# Patient Record
Sex: Male | Born: 1950 | Race: White | Hispanic: No | Smoking: Never smoker
Health system: Southern US, Community
[De-identification: ages and names within clinical notes are randomized; demographics above are authoritative.]

## PROBLEM LIST (undated history)

## (undated) DIAGNOSIS — R011 Cardiac murmur, unspecified: Secondary | ICD-10-CM

## (undated) DIAGNOSIS — H269 Unspecified cataract: Secondary | ICD-10-CM

## (undated) DIAGNOSIS — E7800 Pure hypercholesterolemia, unspecified: Secondary | ICD-10-CM

## (undated) DIAGNOSIS — F329 Major depressive disorder, single episode, unspecified: Secondary | ICD-10-CM

## (undated) DIAGNOSIS — G4730 Sleep apnea, unspecified: Secondary | ICD-10-CM

## (undated) DIAGNOSIS — R569 Unspecified convulsions: Secondary | ICD-10-CM

## (undated) DIAGNOSIS — D649 Anemia, unspecified: Secondary | ICD-10-CM

## (undated) DIAGNOSIS — T7840XA Allergy, unspecified, initial encounter: Secondary | ICD-10-CM

## (undated) DIAGNOSIS — R1310 Dysphagia, unspecified: Secondary | ICD-10-CM

## (undated) DIAGNOSIS — F1910 Other psychoactive substance abuse, uncomplicated: Secondary | ICD-10-CM

## (undated) DIAGNOSIS — N390 Urinary tract infection, site not specified: Secondary | ICD-10-CM

## (undated) DIAGNOSIS — F419 Anxiety disorder, unspecified: Secondary | ICD-10-CM

## (undated) DIAGNOSIS — D229 Melanocytic nevi, unspecified: Secondary | ICD-10-CM

## (undated) DIAGNOSIS — E079 Disorder of thyroid, unspecified: Secondary | ICD-10-CM

## (undated) DIAGNOSIS — F32A Depression, unspecified: Secondary | ICD-10-CM

## (undated) HISTORY — PX: ROTATOR CUFF REPAIR: SHX139

## (undated) HISTORY — DX: Pure hypercholesterolemia, unspecified: E78.00

## (undated) HISTORY — PX: JOINT REPLACEMENT: SHX530

## (undated) HISTORY — DX: Depression, unspecified: F32.A

## (undated) HISTORY — DX: Other psychoactive substance abuse, uncomplicated: F19.10

## (undated) HISTORY — PX: CATARACT EXTRACTION: SUR2

## (undated) HISTORY — DX: Cardiac murmur, unspecified: R01.1

## (undated) HISTORY — DX: Melanocytic nevi, unspecified: D22.9

## (undated) HISTORY — DX: Unspecified convulsions: R56.9

## (undated) HISTORY — DX: Disorder of thyroid, unspecified: E07.9

## (undated) HISTORY — PX: EYE SURGERY: SHX253

## (undated) HISTORY — DX: Sleep apnea, unspecified: G47.30

## (undated) HISTORY — DX: Dysphagia, unspecified: R13.10

## (undated) HISTORY — DX: Unspecified cataract: H26.9

## (undated) HISTORY — DX: Anemia, unspecified: D64.9

## (undated) HISTORY — DX: Allergy, unspecified, initial encounter: T78.40XA

## (undated) HISTORY — DX: Anxiety disorder, unspecified: F41.9

## (undated) HISTORY — DX: Urinary tract infection, site not specified: N39.0

## (undated) HISTORY — DX: Major depressive disorder, single episode, unspecified: F32.9

---

## 2002-01-28 ENCOUNTER — Inpatient Hospital Stay (HOSPITAL_COMMUNITY): Admission: EM | Admit: 2002-01-28 | Discharge: 2002-01-29 | Payer: Self-pay | Admitting: Emergency Medicine

## 2002-02-01 ENCOUNTER — Encounter: Admission: RE | Admit: 2002-02-01 | Discharge: 2002-02-01 | Payer: Self-pay | Admitting: Family Medicine

## 2002-02-13 ENCOUNTER — Encounter: Admission: RE | Admit: 2002-02-13 | Discharge: 2002-02-13 | Payer: Self-pay | Admitting: Family Medicine

## 2002-03-09 ENCOUNTER — Encounter: Payer: Self-pay | Admitting: Gastroenterology

## 2002-03-09 ENCOUNTER — Encounter: Admission: RE | Admit: 2002-03-09 | Discharge: 2002-03-09 | Payer: Self-pay | Admitting: *Deleted

## 2002-11-28 DIAGNOSIS — D229 Melanocytic nevi, unspecified: Secondary | ICD-10-CM

## 2002-11-28 HISTORY — DX: Melanocytic nevi, unspecified: D22.9

## 2004-01-29 ENCOUNTER — Ambulatory Visit (HOSPITAL_COMMUNITY): Admission: RE | Admit: 2004-01-29 | Discharge: 2004-01-29 | Payer: Self-pay | Admitting: Gastroenterology

## 2008-06-19 ENCOUNTER — Ambulatory Visit (HOSPITAL_COMMUNITY): Admission: RE | Admit: 2008-06-19 | Discharge: 2008-06-19 | Payer: Self-pay | Admitting: Orthopedic Surgery

## 2009-05-13 ENCOUNTER — Inpatient Hospital Stay (HOSPITAL_COMMUNITY): Admission: RE | Admit: 2009-05-13 | Discharge: 2009-05-16 | Payer: Self-pay | Admitting: Orthopedic Surgery

## 2009-05-16 ENCOUNTER — Ambulatory Visit: Payer: Self-pay | Admitting: Surgery

## 2009-05-16 ENCOUNTER — Encounter (INDEPENDENT_AMBULATORY_CARE_PROVIDER_SITE_OTHER): Payer: Self-pay | Admitting: Orthopedic Surgery

## 2010-01-01 ENCOUNTER — Encounter: Admission: RE | Admit: 2010-01-01 | Discharge: 2010-01-01 | Payer: Self-pay | Admitting: Emergency Medicine

## 2010-07-06 LAB — URINALYSIS, ROUTINE W REFLEX MICROSCOPIC
Bilirubin Urine: NEGATIVE
Glucose, UA: NEGATIVE mg/dL
Hgb urine dipstick: NEGATIVE
Ketones, ur: NEGATIVE mg/dL
Nitrite: NEGATIVE
Protein, ur: NEGATIVE mg/dL
Specific Gravity, Urine: 1.013 (ref 1.005–1.030)
Urobilinogen, UA: 0.2 mg/dL (ref 0.0–1.0)
pH: 6 (ref 5.0–8.0)

## 2010-07-06 LAB — COMPREHENSIVE METABOLIC PANEL
ALT: 23 U/L (ref 0–53)
AST: 17 U/L (ref 0–37)
Albumin: 4.2 g/dL (ref 3.5–5.2)
Alkaline Phosphatase: 63 U/L (ref 39–117)
BUN: 19 mg/dL (ref 6–23)
CO2: 29 mEq/L (ref 19–32)
Calcium: 9.7 mg/dL (ref 8.4–10.5)
Chloride: 103 mEq/L (ref 96–112)
Creatinine, Ser: 1.09 mg/dL (ref 0.4–1.5)
GFR calc Af Amer: >60 mL/min (ref >60)
GFR calc non Af Amer: >60 mL/min (ref >60)
Glucose, Bld: 100 mg/dL — ABNORMAL HIGH (ref 70–99)
Potassium: 4.2 mEq/L (ref 3.5–5.1)
Sodium: 139 mEq/L (ref 135–145)
Total Bilirubin: 1.2 mg/dL (ref 0.3–1.2)
Total Protein: 7.2 g/dL (ref 6.0–8.3)

## 2010-07-06 LAB — CBC
HCT: 44.3 % (ref 39.0–52.0)
Hemoglobin: 14.9 g/dL (ref 13.0–17.0)
MCHC: 33.7 g/dL (ref 30.0–36.0)
MCHC: 33.9 g/dL (ref 30.0–36.0)
MCHC: 34.1 g/dL (ref 30.0–36.0)
MCV: 88.5 fL (ref 78.0–100.0)
MCV: 88.6 fL (ref 78.0–100.0)
Platelets: 177 10*3/uL (ref 150–400)
Platelets: 190 10*3/uL (ref 150–400)
Platelets: 235 10*3/uL (ref 150–400)
RBC: 3.86 MIL/uL — ABNORMAL LOW (ref 4.22–5.81)
RBC: 5 MIL/uL (ref 4.22–5.81)
RDW: 13.4 % (ref 11.5–15.5)
RDW: 13.6 % (ref 11.5–15.5)
WBC: 6.1 10*3/uL (ref 4.0–10.5)
WBC: 8.5 10*3/uL (ref 4.0–10.5)

## 2010-07-06 LAB — PROTIME-INR
INR: 1.04 (ref 0.00–1.49)
INR: 1.21 (ref 0.00–1.49)
INR: 1.6 — ABNORMAL HIGH (ref 0.00–1.49)
Prothrombin Time: 13.5 seconds (ref 11.6–15.2)
Prothrombin Time: 15.2 seconds (ref 11.6–15.2)
Prothrombin Time: 16.2 seconds — ABNORMAL HIGH (ref 11.6–15.2)
Prothrombin Time: 18.9 seconds — ABNORMAL HIGH (ref 11.6–15.2)

## 2010-07-06 LAB — BASIC METABOLIC PANEL
BUN: 7 mg/dL (ref 6–23)
CO2: 26 mEq/L (ref 19–32)
Calcium: 8.2 mg/dL — ABNORMAL LOW (ref 8.4–10.5)
Creatinine, Ser: 0.97 mg/dL (ref 0.4–1.5)
Creatinine, Ser: 0.98 mg/dL (ref 0.4–1.5)
GFR calc non Af Amer: >60 mL/min (ref >60)
Glucose, Bld: 121 mg/dL — ABNORMAL HIGH (ref 70–99)

## 2010-07-06 LAB — ABO/RH: ABO/RH(D): O POS

## 2010-07-06 LAB — TYPE AND SCREEN: ABO/RH(D): O POS

## 2010-09-05 NOTE — H&P (Signed)
Ronnie Willis, Ronnie Willis                          ACCOUNT NO.:  [id number]   MEDICAL RECORD NO.:  [id number]                   PATIENT TYPE:  INP   LOCATION:  3309                                 FACILITY:  [facility]   PHYSICIAN:  Pearlean Brownie, MD              DATE OF BIRTH:  [birth year]   DATE OF ADMISSION:  01/28/2002  DATE OF DISCHARGE:  01/29/2002                                HISTORY & PHYSICAL   CHIEF COMPLAINT:  Unresponsiveness.   HISTORY OF PRESENT ILLNESS:  This patient is a 60 year old white male with a  history of GERD and hiatal hernia who per report was seen 2 months prior to  admission at urgent medical care, at which time he was diagnosed with  bilateral pneumonia and treated with Avalox and Tussionex.  His pneumonia  resolved, and he was in his usual state, and his cough and shortness of  breath returned, and he was again seen by urgent medical care.  He was again  started on Avalox for possible pneumonia.  His  symptoms improved somewhat,  and he was re-evaluated on October 8, at which point he continued to have  cough and shortness of breath, as well as myalgias.  A white blood count at  that time was 16.5, and a chest x-ray showed right middle lobe infiltrate  with possible perihilar infiltrates.  He was given 1 gm of Rocephin IM and  prescribed Augmentin-XR.  He returned to the clinic on the day prior to  admission complaining of itching and burning pain in his bilateral arms,  chest, upper back, and upper thighs.  A white count was performed and found  be 12.2.  Per urgent medical care records, the patient complained of his  hands feeling like on fire.  Per report, he was found to have on  neurological exam, decreased sensation and deep tendon reflexes.  His  Tussionex was stopped, and he was given a prescription for 7 Ambien to  assist him with sleep and 16 Tylox for pain.  At that time, the doctors at  the urgent medical clinic considered the possibility that  the patient was  suffering possibly from Guillain-Barre syndrome.  On the night of admission,  the patient's wife returned home and found him to be unresponsive after  having been alone for much of the day.  She took him to urgent medical care  where he was treated with 0.4 mg of Narcan x2 with improved responsiveness.  At urgent medical care, he was hemodynamically stable with an oxygen  saturation of 95%.  He was transferred to [facility] for observation and  treatment of a possible overdose.  The patient admits to having two or three  beers on the night prior to admission and one to two beers on the day of  admission, which he took to help with the burning pain. He denies suicidal  ideation at any  time in the past.  He states that he took his Tylox one at a  time every four hours and that he took approximately two Ambien once on the  day of admission to help with his pain.  He also admits to taking ibuprofen  1-2 tabs every 4 hours on the day of admission to help with the pain.   PAST MEDICAL HISTORY:  1. GERD.  2. Hiatal hernia.  3. Allergic rhinitis.  4. Possible depression, treated in the past with antidepressants.   MEDICATIONS:  1. Prilosec.  2. Augmentin-XR, which was started on October 8.  3. Ambien, started on October 10.  4. Tylox, started on October 10.  5. Ibuprofen.   ALLERGIES:  No known drug allergies, although guiafenesin causes him to have  nausea.   SOCIAL HISTORY:  He is employed as a Engineer, agricultural.  He lives with his  wife and two daughters, one of whom is adopted from [place].  He and his wife  lost a third (oldest) daughter at the age of 6-1/2, approximately 10 years  ago.  He reports drinking several beers possibly one to two nights a week.  He reports a distant history of cocaine and marijuana use approximately 20  years ago.  He denies any history of tobacco use.  He reports no significant  problems at home or at work.   FAMILY HISTORY:   Noncontributory.   REVIEW OF SYSTEMS:  Positive for hypersensitivity of bilateral upper  extremities, scratching unconsciously left upper back, chest, and arm.  He  denies any constipation, diarrhea, cough, shortness of breath, or dyspnea on  exertion currently.  He denies any itching or urinary complaints currently.  He does complain of feeling worn out, having not slept in the past 4-5  nights.   PHYSICAL EXAMINATION:  VITAL SIGNS:  Included a temperature of 96.3, blood  pressure of 155/91, pulse of 87, respirations of 26, and oxygen saturation  of 98% on 2 liters O2 by nasal cannula.  GENERAL:  He is a well-developed, well-nourished white male, somewhat  irritated, and at times appeared drowsy, otherwise patient's head falling  back onto the bed.  However, he remained responsive throughout examination.  HEENT:  Miotic pupils, minimally reactive, anywhere from 3 mm to 2 mm in the  presence of light, not photophobia, noninjected, anicteric sclera.  TM's  pearly gray.  OP clear with multiple fillings but otherwise good dentition.  Nasal mucosa is boggy without erythema.  NECK:  Supple.  No lymphadenopathy or thyromegaly.  CARDIOVASCULAR:  Regular rate and rhythm.  Normal S1 and S2 with no murmurs,  rubs, or gallops.  LUNGS:  Decreased breath sounds at the bases bilaterally with rhonchi in the  right mid to lower lung, best heard in the axillary area.  ABDOMEN:  Soft, obese.  Normoactive bowel sounds.  Nontender. Nondistended.  EXTREMITIES:  Full range of motion x4.  No edema or cyanosis.  NEUROLOGICAL:  Cranial nerves II-XII are intact.  Strength 5/5 bilateral  upper and lower extremities.  Sensation intact to dull and sharp touch, as  well as soft touch, bilateral upper extremities.  DTRs 2+ and symmetric,  bilateral biceps and patellar.  Cerebellar function is intact with good  finger-to-nose and rapid alternating movements, no hypersensitivity was  noted. SKIN: There is some mild  erythema over the superior chest but no distinct  rashes noted.  No petechiae noted.   ASSESSMENT AND PLAN:  28. 60 year old man with pneumonia, new onset  of burning/itching of bilateral     upper extremities.  2. Unresponsiveness:  likely secondary to Tylox overdose.  Increased     responsiveness after two doses of Narcan.  The patient also reports     concomitant use of ibuprofen, alcohol, and Ambien.  We will check urine     drug screen, acetaminophen, salicylate, and alcohol levels, as well as     CMET. There is no history of suicidal ideation, so there is no need for     suicide precautions at this time.  Narcan will be administered as needed.  3. Pneumonia (right middle lobe pneumonia on chest x-ray of 10/8):     Augmentin (day #4 today) and administer O2 by nasal cannula as needed to     maintain sats greater than 92%.  No need for a repeat chest x-ray at this     time.  ABG will be checked in the morning.  4.     Bilateral upper extremity burning/pain:  Guillain-Barre syndrome was     considered as a possibility at urgent medical care.  The patient's     symptoms do not appear consistent with this syndrome.  We will follow his     symptoms and treat as needed.  If the workup comes out Guillain-Barre,     consider EMG or an LP.     Georgina Peer, M.D.                 Pearlean Brownie, MD    JM/MEDQ  D:  01/29/2002  T:  01/31/2002  Job:  [id number]   cc:   Oley Balm. Georgina Pillion, M.D.  [address]  [place]  [place] [address]  Fax: [phone]   Stan Head. Cleta Alberts, M.D.

## 2010-09-05 NOTE — Op Note (Signed)
Ronnie Willis, Ronnie Willis                ACCOUNT NO.:  [id number]   MEDICAL RECORD NO.:  [id number]          PATIENT TYPE:  AMB   LOCATION:  ENDO                         FACILITY:  [facility]   PHYSICIAN:  James L. Malon Kindle., M.D.DATE OF BIRTH:  Jan 11, 1951   DATE OF PROCEDURE:  01/29/2004  DATE OF DISCHARGE:                                 OPERATIVE REPORT   PROCEDURE:  Colonoscopy.   MEDICATIONS:  Fentanyl 125 mcg, Versed 12 mg IV.   INDICATIONS:  Colon cancer screening.   DESCRIPTION OF PROCEDURE:  The procedure had been explained to the patient,  and consent obtained. With the patient in the left lateral decubitus  position, the Olympus scope was inserted and advanced.  The prep was  excellent.  We were able to reach the cecum with minimal difficulty.  The  scope was withdrawn.  The cecum, ascending colon, transverse colon, splenic  flexure, descending and sigmoid colon were seen well and were unremarkable.  No polyps were seen.  There were no diverticulosis.  The scope was withdrawn  to the rectum, and the rectum was free of polyps.  The scope was withdrawn,  and the patient tolerated the procedure well.   ASSESSMENT:  Normal screening colonoscopy.  V76.51.   PLAN:  Yearly Hemoccults.  Consider repeating procedure again in 10 years or  for specific indications.      JLE/MEDQ  D:  01/29/2004  T:  01/29/2004  Job:  [id number]   cc:   Jonita Albee, M.D.  [facility]  [address]  [place]  [place] [address]  Fax: [phone]

## 2010-09-05 NOTE — Discharge Summary (Signed)
Ronnie Willis, Ronnie Willis                          ACCOUNT NO.:  [id number]   MEDICAL RECORD NO.:  [id number]                   PATIENT TYPE:  INP   LOCATION:  3309                                 FACILITY:  [facility]   PHYSICIAN:  Pearlean Brownie, MD              DATE OF BIRTH:  [birth year]   DATE OF ADMISSION:  01/28/2002  DATE OF DISCHARGE:  01/29/2002                                 DISCHARGE SUMMARY   DISCHARGE DIAGNOSES:  1. Medication overdose.  2. Pneumonia.  3. Gastroesophageal reflux disease.  4. Depression.  5. Abnormal liver function tests.   LABORATORY DATA:  1. CK 96 on discharge.  2. Complete metabolic panel at discharge showed a sodium of 131, potassium     3.9, chloride 98, CO2 24, glucose 128, BUN 10, creatinine 0.9, bilirubin     1.8, alkaline phosphatase 219, SGOT 27, SGPT 146, total protein 7.5,     albumin 3.8, and a calcium of 9.2.  3. CBC at discharge showed a white blood cell count of 15.7, hemoglobin     15.1, hematocrit 45.9, and platelet count 304,000.  4. Urine drug screen was negative except for opiates.  5. Tricyclic screen was negative.  6. Acetaminophen level was negative.  7. Salicylate level was negative.  8. Alcohol level was negative.   HISTORY OF PRESENT ILLNESS:  This is a 60 year old white male with history  of GERD and hiatal hernia who presented to [facility] two  months prior to admission with diagnosis of bilateral pneumonia.  He was  treated with Avalox and Tussionex which resolved his symptoms.  Recently a  couple of weeks ago, he presented again with cough and shortness of breath.  He was treated with Avalox again.  He was reevaluated a week later and was  changed to Rocephin and Augmentin for steal symptoms with cough and  myalgias.  He presented to the ED with complaints of itching and burning in  his bilateral arms, chest, and upper back and upper thighs.  He was also  treated with Ambien, Tylox.  He also admitted  to drinking some beers prior  to admission.  He presented to the ED after being found by his wife to be  unresponsive. He was treated with Narcan in the emergency room which showed  some increase of his responsiveness.  He was admitted for observation.   HOSPITAL COURSE:  During his admission, he was tested for other etiologies  for his unresponsiveness.  EKG was normal.  Drug screens were normal except  for opiates.  He was monitored on telemetry without any significant events.  His symptoms gradually resolved where he was asymptomatic at discharge.  At  this time, it is believed that this was due to medication overdose.  He was  continued on his Augmentin for possible pneumonia.  However, an adverse  reaction to Rocephin cannot be eliminated as causing  his burning sensation.  He was discharged to home after further tests.  He is to follow up closely  with Dr. Georgina Willis a week after discharge.   DISCHARGE MEDICATIONS:  1. Augmentin XR 2 tablets p.o. q.12h. x 2 days.  2. Pepcid p.r.n. for acid reflux.  3. Tylenol p.r.n. fever and pain.  4. Benadryl p.r.n. itching and insomnia.   He is advised to stop Tylox and Ambien as well as alcohol.   ACTIVITY:  He is to use hand rails when walking.  Use assistance whenever  possible.   DIET:  He is avoid alcohol.   WOUND CARE:  Not applicable.   SPECIAL INSTRUCTIONS:  He is to call his doctor or come back to the hospital  for further change in mentation, shortness of breath, chest pain, weakness  or numbness.   FOLLOW UP:  Keep his appointment with Dr. Georgina Willis later this week.     Ronnie Willis, M.D.                            Pearlean Brownie, MD    DV/MEDQ  D:  01/29/2002  T:  01/30/2002  Job:  [id number]   cc:   Ronnie Willis, M.D.  [address]  [place]  [place] [address]  Fax: [phone]   Ronnie Willis, M.D.

## 2011-05-11 ENCOUNTER — Ambulatory Visit (INDEPENDENT_AMBULATORY_CARE_PROVIDER_SITE_OTHER): Payer: BC Managed Care – PPO

## 2011-05-11 DIAGNOSIS — R197 Diarrhea, unspecified: Secondary | ICD-10-CM

## 2011-05-11 DIAGNOSIS — R51 Headache: Secondary | ICD-10-CM

## 2011-05-11 DIAGNOSIS — R112 Nausea with vomiting, unspecified: Secondary | ICD-10-CM

## 2011-05-11 DIAGNOSIS — IMO0001 Reserved for inherently not codable concepts without codable children: Secondary | ICD-10-CM

## 2011-08-31 ENCOUNTER — Other Ambulatory Visit: Payer: Self-pay | Admitting: Internal Medicine

## 2011-09-02 ENCOUNTER — Ambulatory Visit (INDEPENDENT_AMBULATORY_CARE_PROVIDER_SITE_OTHER): Payer: BC Managed Care – PPO | Admitting: Emergency Medicine

## 2011-09-02 VITALS — BP 135/83 | HR 59 | Temp 98.0°F | Resp 16 | Ht 70.5 in | Wt 198.0 lb

## 2011-09-02 DIAGNOSIS — F419 Anxiety disorder, unspecified: Secondary | ICD-10-CM

## 2011-09-02 DIAGNOSIS — E039 Hypothyroidism, unspecified: Secondary | ICD-10-CM

## 2011-09-02 DIAGNOSIS — J309 Allergic rhinitis, unspecified: Secondary | ICD-10-CM

## 2011-09-02 DIAGNOSIS — F411 Generalized anxiety disorder: Secondary | ICD-10-CM

## 2011-09-02 DIAGNOSIS — K219 Gastro-esophageal reflux disease without esophagitis: Secondary | ICD-10-CM

## 2011-09-02 MED ORDER — QUETIAPINE FUMARATE 50 MG PO TABS: 50.0000 mg | ORAL_TABLET | Freq: Every day | ORAL | Status: DC

## 2011-09-02 MED ORDER — FLUTICASONE PROPIONATE 50 MCG/ACT NA SUSP: 2.0000 | Freq: Every day | NASAL | Status: DC

## 2011-09-02 MED ORDER — LEVOTHYROXINE SODIUM 50 MCG PO TABS: 50.0000 ug | ORAL_TABLET | Freq: Every day | ORAL | Status: DC

## 2011-09-02 MED ORDER — ESOMEPRAZOLE MAGNESIUM 40 MG PO CPDR: 40.0000 mg | DELAYED_RELEASE_CAPSULE | Freq: Every day | ORAL | Status: DC

## 2011-09-02 MED ORDER — LORAZEPAM 1 MG PO TABS: 1.0000 mg | ORAL_TABLET | Freq: Two times a day (BID) | ORAL | Status: DC

## 2011-09-02 NOTE — Progress Notes (Signed)
  Subjective:    Patient ID: Ronnie Willis, male    DOB: 1950-08-23, 61 y.o.   MRN: [id number]  HPI patient states he's been out of his Flonase which helps him a lot with his allergy symptoms. He has had recent head congestion with drainage and this has been worse with his allergies his girlfriend had surgery recently. He has been under a great deal of stress since that surgery was performed to    Review of Systems     Objective:   Physical Exam TMs are clear. Nose normal chest clear        Assessment & Plan:  Need refill medicines for allergic rhinitis. Refilled his medicines for anxiety for 6 months. I did increase his Seroquel 50 mg at bedtime to help him sleep better

## 2011-10-23 ENCOUNTER — Ambulatory Visit (INDEPENDENT_AMBULATORY_CARE_PROVIDER_SITE_OTHER): Payer: BC Managed Care – PPO | Admitting: Emergency Medicine

## 2011-10-23 VITALS — BP 136/78 | HR 55 | Temp 97.5°F | Resp 16 | Ht 71.0 in | Wt 198.6 lb

## 2011-10-23 DIAGNOSIS — IMO0001 Reserved for inherently not codable concepts without codable children: Secondary | ICD-10-CM

## 2011-10-23 DIAGNOSIS — F419 Anxiety disorder, unspecified: Secondary | ICD-10-CM

## 2011-10-23 DIAGNOSIS — F411 Generalized anxiety disorder: Secondary | ICD-10-CM

## 2011-10-23 DIAGNOSIS — M2550 Pain in unspecified joint: Secondary | ICD-10-CM

## 2011-10-23 LAB — RHEUMATOID FACTOR: Rhuematoid fact SerPl-aCnc: <10 IU/mL (ref <=14)

## 2011-10-23 LAB — POCT CBC
Granulocyte percent: 64.9 %G (ref 37–80)
HCT, POC: 47.7 % (ref 43.5–53.7)
Hemoglobin: 15.4 g/dL (ref 14.1–18.1)
MCV: 89.7 fL (ref 80–97)
POC LYMPH PERCENT: 28.5 %L (ref 10–50)
RBC: 5.32 M/uL (ref 4.69–6.13)
RDW, POC: 14.6 %

## 2011-10-23 LAB — CK: Total CK: 69 U/L (ref 7–232)

## 2011-10-23 MED ORDER — PREGABALIN 50 MG PO CAPS: ORAL_CAPSULE | ORAL | Status: DC

## 2011-10-23 MED ORDER — LORAZEPAM 1 MG PO TABS: ORAL_TABLET | ORAL | Status: DC

## 2011-10-23 NOTE — Progress Notes (Signed)
  Subjective:    Patient ID: Ronnie Willis, male    DOB: April 12, 1951, 61 y.o.   MRN: [id number]  HPI patient has had recent trouble with his joints and muscles. He says he feels sore all over. He tried a friend's Lyrica 50 mg and it did seem to help. An underlying stress recently. 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 He does have a history of addiction and we have been very cautious with his medications.    Review of Systems     Objective:   Physical Exam patient is alert cooperative not in distress. Neck is supple. Chest clear heart regular rate no murmurs examination of the upper extremities reveals no evidence of inflammation of the joints the  Results for orders placed in visit on 10/23/11  POCT CBC      Component Value Range   WBC 5.8  4.6 - 10.2 K/uL   Lymph, poc 1.7  0.6 - 3.4   POC LYMPH PERCENT 28.5  10 - 50 %L   MID (cbc) 0.4  0 - 0.9   POC MID % 6.6  0 - 12 %M   POC Granulocyte 3.8  2 - 6.9   Granulocyte percent 64.9  37 - 80 %G   RBC 5.32  4.69 - 6.13 M/uL   Hemoglobin 15.4  14.1 - 18.1 g/dL   HCT, POC 40.9  81.1 - 53.7 %   MCV 89.7  80 - 97 fL   MCH, POC 28.9  27 - 31.2 pg   MCHC 32.3  31.8 - 35.4 g/dL   RDW, POC 91.4     Platelet Count, POC 275  142 - 424 K/uL   MPV 10.6  0 - 99.8 fL        Assessment & Plan:  We'll check some basic connective tissue panel tests including CBC sedimentation rate CPK RA and ANA. I think Lyrica is a good choice for him. I gave him an extra 15 Ativan per month.

## 2011-10-26 LAB — ANA: Anti Nuclear Antibody(ANA): NEGATIVE

## 2011-11-12 ENCOUNTER — Telehealth: Payer: Self-pay

## 2011-11-12 NOTE — Telephone Encounter (Signed)
PT STATES HE WOKE UP REALLY NAUSEA THIS MORNING AND WOULD LIKE TO HAVE SOME ZOFRAN CALLED IN PLEASE CALL PT AT [phone]    [facility]

## 2011-11-13 MED ORDER — ONDANSETRON HCL 4 MG PO TABS: 4.0000 mg | ORAL_TABLET | Freq: Three times a day (TID) | ORAL | Status: AC | PRN

## 2011-11-13 NOTE — Telephone Encounter (Signed)
Done, but he needs to come in if this persists or worsens or he develops other symptoms.

## 2011-11-15 NOTE — Telephone Encounter (Signed)
LMOM THAT RX WAS SENT IN AND THAT HE NEEDS TO RTC IF WORSENS OR DEVELOP OTHER SYMPTOMS

## 2012-01-23 ENCOUNTER — Ambulatory Visit (INDEPENDENT_AMBULATORY_CARE_PROVIDER_SITE_OTHER): Payer: BC Managed Care – PPO | Admitting: Emergency Medicine

## 2012-01-23 VITALS — BP 111/71 | HR 63 | Temp 98.0°F | Resp 16 | Ht 72.5 in | Wt 202.6 lb

## 2012-01-23 DIAGNOSIS — J029 Acute pharyngitis, unspecified: Secondary | ICD-10-CM

## 2012-01-23 DIAGNOSIS — F419 Anxiety disorder, unspecified: Secondary | ICD-10-CM | POA: Insufficient documentation

## 2012-01-23 DIAGNOSIS — K625 Hemorrhage of anus and rectum: Secondary | ICD-10-CM

## 2012-01-23 DIAGNOSIS — F411 Generalized anxiety disorder: Secondary | ICD-10-CM

## 2012-01-23 DIAGNOSIS — J019 Acute sinusitis, unspecified: Secondary | ICD-10-CM

## 2012-01-23 DIAGNOSIS — J329 Chronic sinusitis, unspecified: Secondary | ICD-10-CM

## 2012-01-23 DIAGNOSIS — F1911 Other psychoactive substance abuse, in remission: Secondary | ICD-10-CM

## 2012-01-23 LAB — POCT RAPID STREP A (OFFICE): Rapid Strep A Screen: NEGATIVE

## 2012-01-23 LAB — IFOBT (OCCULT BLOOD): IFOBT: NEGATIVE

## 2012-01-23 MED ORDER — LORAZEPAM 1 MG PO TABS: ORAL_TABLET | ORAL | Status: DC

## 2012-01-23 MED ORDER — AMOXICILLIN-POT CLAVULANATE 875-125 MG PO TABS: 1.0000 | ORAL_TABLET | Freq: Two times a day (BID) | ORAL | Status: DC

## 2012-01-23 NOTE — Progress Notes (Signed)
  Subjective:    Patient ID: Ronnie Willis, male    DOB: 06/30/1950, 61 y.o.   MRN: [id number]  HPI patient enters with 2 issues #1 is yesterday he had the onset of head congestion sore throat this was not associated with fever. He now developed facial pain and frontal headache and a mustard colored nasal drainage. Second problem is rectal bleeding he had a colonoscopy approximately 10 years ago with normal findings. He had 2 episodes yesterday of bright red blood on the toilet paper when he wiped.    Review of Systems     Objective:   Physical Exam TMs are clear. The nose is congested. Chest is clear to auscultation and percussion. Examination of the throat reveals moderate redness there is no cervical adenopathy noted. Rectal exam reveals small external hemorrhoidal tags. No rectal fissure was seen prostate felt normal stool was obtained and sent for hematuria        Assessment & Plan:  We'll go ahead and treat is a sinusitis with Flonase and Augmentin. With the history of rectal bleeding we'll refer back to Dr. Randa Evens.

## 2012-02-05 ENCOUNTER — Telehealth: Payer: Self-pay

## 2012-02-05 MED ORDER — IPRATROPIUM BROMIDE 0.03 % NA SOLN: 2.0000 | Freq: Two times a day (BID) | NASAL | Status: DC

## 2012-02-05 MED ORDER — CEFDINIR 300 MG PO CAPS: 600.0000 mg | ORAL_CAPSULE | Freq: Every day | ORAL | Status: DC

## 2012-02-05 MED ORDER — GUAIFENESIN ER 1200 MG PO TB12: 1.0000 | ORAL_TABLET | Freq: Two times a day (BID) | ORAL | Status: DC | PRN

## 2012-02-05 NOTE — Telephone Encounter (Signed)
Spoke with pt--he is still coughing at ngiht. Has yellow and green mucous when he blows his nose. He states he just still feels bad. He started feeling better and then he went back to feeling bad again. Please advise.

## 2012-02-05 NOTE — Telephone Encounter (Signed)
Rx's sent to the pharmacy.  If his symptoms are not improving, RTC.

## 2012-02-05 NOTE — Telephone Encounter (Signed)
Pt is requesting another round of antibiotics.  Seen last Saturday.  Not any better.   CBN:  [id number]

## 2012-02-05 NOTE — Telephone Encounter (Signed)
Pt is requesting another round of antibiotics.  Still no better, seen last Saturday.   CBN:   [phone]

## 2012-02-06 NOTE — Telephone Encounter (Signed)
Advised pt that rx's were sent in and pt has already picked up rx's

## 2012-03-10 ENCOUNTER — Telehealth: Payer: Self-pay | Admitting: *Deleted

## 2012-03-10 DIAGNOSIS — F419 Anxiety disorder, unspecified: Secondary | ICD-10-CM

## 2012-03-10 MED ORDER — QUETIAPINE FUMARATE 50 MG PO TABS: 50.0000 mg | ORAL_TABLET | Freq: Every day | ORAL | Status: DC

## 2012-03-10 NOTE — Telephone Encounter (Signed)
Pharmacy requesting refill on seroquel 50mg .  Last filled on 02/12/12

## 2012-03-10 NOTE — Telephone Encounter (Signed)
Sent to pharmacy - please have patient call pharmacy next time for refill.

## 2012-03-11 NOTE — Telephone Encounter (Signed)
Spoke with patient and let him know that rx was called into pharmacy.  And to contact pharmacy for refill.

## 2012-04-19 ENCOUNTER — Ambulatory Visit (INDEPENDENT_AMBULATORY_CARE_PROVIDER_SITE_OTHER): Payer: BC Managed Care – PPO | Admitting: Emergency Medicine

## 2012-04-19 VITALS — BP 112/71 | HR 56 | Temp 97.4°F | Resp 16 | Ht 71.0 in | Wt 197.0 lb

## 2012-04-19 DIAGNOSIS — F419 Anxiety disorder, unspecified: Secondary | ICD-10-CM

## 2012-04-19 DIAGNOSIS — E039 Hypothyroidism, unspecified: Secondary | ICD-10-CM

## 2012-04-19 DIAGNOSIS — J309 Allergic rhinitis, unspecified: Secondary | ICD-10-CM

## 2012-04-19 DIAGNOSIS — F411 Generalized anxiety disorder: Secondary | ICD-10-CM

## 2012-04-19 DIAGNOSIS — K219 Gastro-esophageal reflux disease without esophagitis: Secondary | ICD-10-CM

## 2012-04-19 DIAGNOSIS — Z23 Encounter for immunization: Secondary | ICD-10-CM

## 2012-04-19 MED ORDER — LORAZEPAM 1 MG PO TABS: ORAL_TABLET | ORAL | Status: DC

## 2012-04-19 MED ORDER — LIDOCAINE HCL 2 % EX GEL: CUTANEOUS | Status: DC | PRN

## 2012-04-19 MED ORDER — LEVOTHYROXINE SODIUM 50 MCG PO TABS: 50.0000 ug | ORAL_TABLET | Freq: Every day | ORAL | Status: DC

## 2012-04-19 MED ORDER — ESOMEPRAZOLE MAGNESIUM 40 MG PO CPDR: 40.0000 mg | DELAYED_RELEASE_CAPSULE | Freq: Every day | ORAL | Status: DC

## 2012-04-19 MED ORDER — QUETIAPINE FUMARATE 50 MG PO TABS: 50.0000 mg | ORAL_TABLET | Freq: Every day | ORAL | Status: DC

## 2012-04-19 MED ORDER — FLUTICASONE PROPIONATE 50 MCG/ACT NA SUSP: 2.0000 | Freq: Every day | NASAL | Status: DC

## 2012-04-19 NOTE — Progress Notes (Signed)
  Subjective:    Patient ID: Ronnie Willis, male    DOB: 1950-05-11, 61 y.o.   MRN: [id number]  HPI Headache on top of head two weeks ago. Now he is having bumps on the top of head that it's painful, itchy and burning. Patient states that the bumps are not getting worse.  He states that he has had the Shingles vaccine. Patient also has red bumps on his forehead. Patient would also like a flu shot. Review of Systems     Objective:   Physical Exam there are 2 small 2-3 mm red crusted areas one on the right occipital area and one over the right eyebrow        Assessment & Plan:  I suspect the patient has shingles eruption but it did not manifest itself significantly because he had had previous shingles vaccine. Meds were refilled today. He is continuing to go to meetings regularly. He does have a sponsor and he gets along with. I gave him some Xylocaine jelly to put on the lesions that are giving him discomfort and refill his other medications.

## 2012-08-16 ENCOUNTER — Telehealth: Payer: Self-pay

## 2012-08-16 DIAGNOSIS — F419 Anxiety disorder, unspecified: Secondary | ICD-10-CM

## 2012-08-16 NOTE — Telephone Encounter (Signed)
Pharm requests RF of lorazepam 1 tab 2-3 times daily prn stress.

## 2012-08-16 NOTE — Telephone Encounter (Signed)
It is okay to refill his Ativan 1 mg twice a day as needed for stress. On extremely stressful days he can take up to 3 so dispense #75 with one refill

## 2012-08-17 MED ORDER — LORAZEPAM 1 MG PO TABS: ORAL_TABLET | ORAL | Status: DC

## 2012-08-17 NOTE — Telephone Encounter (Signed)
sent 

## 2012-09-30 ENCOUNTER — Other Ambulatory Visit: Payer: Self-pay | Admitting: Emergency Medicine

## 2012-09-30 NOTE — Telephone Encounter (Signed)
Needs OV.  

## 2012-10-12 ENCOUNTER — Ambulatory Visit (INDEPENDENT_AMBULATORY_CARE_PROVIDER_SITE_OTHER): Payer: BC Managed Care – PPO | Admitting: Emergency Medicine

## 2012-10-12 ENCOUNTER — Telehealth: Payer: Self-pay | Admitting: Radiology

## 2012-10-12 VITALS — BP 118/73 | HR 68 | Temp 98.0°F | Resp 17 | Ht 71.5 in | Wt 206.0 lb

## 2012-10-12 DIAGNOSIS — F419 Anxiety disorder, unspecified: Secondary | ICD-10-CM

## 2012-10-12 DIAGNOSIS — J029 Acute pharyngitis, unspecified: Secondary | ICD-10-CM

## 2012-10-12 DIAGNOSIS — K219 Gastro-esophageal reflux disease without esophagitis: Secondary | ICD-10-CM

## 2012-10-12 DIAGNOSIS — G4700 Insomnia, unspecified: Secondary | ICD-10-CM

## 2012-10-12 DIAGNOSIS — F411 Generalized anxiety disorder: Secondary | ICD-10-CM

## 2012-10-12 LAB — POCT RAPID STREP A (OFFICE): Rapid Strep A Screen: NEGATIVE

## 2012-10-12 MED ORDER — ESOMEPRAZOLE MAGNESIUM 40 MG PO CPDR: 40.0000 mg | DELAYED_RELEASE_CAPSULE | Freq: Every day | ORAL | Status: DC

## 2012-10-12 MED ORDER — MAGIC MOUTHWASH W/LIDOCAINE: ORAL | Status: DC

## 2012-10-12 MED ORDER — QUETIAPINE FUMARATE 50 MG PO TABS: 50.0000 mg | ORAL_TABLET | Freq: Every day | ORAL | Status: DC

## 2012-10-12 MED ORDER — LORAZEPAM 1 MG PO TABS: ORAL_TABLET | ORAL | Status: DC

## 2012-10-12 NOTE — Patient Instructions (Addendum)
Sore Throat A sore throat is pain, burning, irritation, or scratchiness of the throat. There is often pain or tenderness when swallowing or talking. A sore throat may be accompanied by other symptoms, such as coughing, sneezing, fever, and swollen neck glands. A sore throat is often the first sign of another sickness, such as a cold, flu, strep throat, or mononucleosis (commonly known as mono). Most sore throats go away without medical treatment. CAUSES  The most common causes of a sore throat include:  A viral infection, such as a cold, flu, or mono.  A bacterial infection, such as strep throat, tonsillitis, or whooping cough.  Seasonal allergies.  Dryness in the air.  Irritants, such as smoke or pollution.  Gastroesophageal reflux disease (GERD). HOME CARE INSTRUCTIONS   Only take over-the-counter medicines as directed by your caregiver.  Drink enough fluids to keep your urine clear or pale yellow.  Rest as needed.  Try using throat sprays, lozenges, or sucking on hard candy to ease any pain (if older than 4 years or as directed).  Sip warm liquids, such as broth, herbal tea, or warm water with honey to relieve pain temporarily. You may also eat or drink cold or frozen liquids such as frozen ice pops.  Gargle with salt water (mix 1 tsp salt with 8 oz of water).  Do not smoke and avoid secondhand smoke.  Put a cool-mist humidifier in your bedroom at night to moisten the air. You can also turn on a hot shower and sit in the bathroom with the door closed for 5 10 minutes. SEEK IMMEDIATE MEDICAL CARE IF:  You have difficulty breathing.  You are unable to swallow fluids, soft foods, or your saliva.  You have increased swelling in the throat.  Your sore throat does not get better in 7 days.  You have nausea and vomiting.  You have a fever or persistent symptoms for more than 2 3 days.  You have a fever and your symptoms suddenly get worse. MAKE SURE YOU:   Understand  these instructions.  Will watch your condition.  Will get help right away if you are not doing well or get worse. Document Released: 05/14/2004 Document Revised: 03/23/2012 Document Reviewed: 12/13/2011 [employer] Patient Information 2014 [employer].  

## 2012-10-12 NOTE — Progress Notes (Signed)
  Subjective:    Patient ID: Ronnie Willis, male    DOB: [birth year], 62 y.o.   MRN: [id number]  HPI patient presents with onset last night of severe throat pain difficulty swallowing. He was some better through the night and then worse again this morning. He denies fever or chills. He feels swollen in the back of his throat. He has not had a cough    Review of Systems     Objective:   Physical Exam patient is alert and cooperative he is not in any distress. His neck is supple. The uvula is swollen The tongue is normal. Chest is clear to auscultation and percussion   Results for orders placed in visit on 10/12/12  POCT RAPID STREP A (OFFICE)      Result Value Range   Rapid Strep A Screen Negative  Negative        Assessment & Plan:  Strep test negative. Culture sent. Will treat with Magic mouthwash. Meds were refilled

## 2012-10-12 NOTE — Telephone Encounter (Signed)
Pharmacy called about mouthwash.

## 2012-10-13 ENCOUNTER — Telehealth: Payer: Self-pay

## 2012-10-13 ENCOUNTER — Other Ambulatory Visit: Payer: Self-pay | Admitting: Emergency Medicine

## 2012-10-13 DIAGNOSIS — J029 Acute pharyngitis, unspecified: Secondary | ICD-10-CM

## 2012-10-13 MED ORDER — AMOXICILLIN 875 MG PO TABS: 875.0000 mg | ORAL_TABLET | Freq: Two times a day (BID) | ORAL | Status: DC

## 2012-10-13 NOTE — Telephone Encounter (Signed)
Called and spoke with patient we'll treat with amoxicillin 875 twice a day pending culture results

## 2012-10-13 NOTE — Telephone Encounter (Signed)
Pt c/o his throat still being in pain, he was seen in our office yesterday and was prescribed mouthwash for the pain since the rapid strep test came back negative but it is not working for him he states that he actually feels worse. Best  [phone]

## 2012-10-14 LAB — CULTURE, GROUP A STREP

## 2012-12-30 ENCOUNTER — Other Ambulatory Visit: Payer: Self-pay | Admitting: Emergency Medicine

## 2013-01-02 ENCOUNTER — Other Ambulatory Visit: Payer: Self-pay | Admitting: Radiology

## 2013-02-18 ENCOUNTER — Ambulatory Visit (INDEPENDENT_AMBULATORY_CARE_PROVIDER_SITE_OTHER): Payer: BC Managed Care – PPO | Admitting: Emergency Medicine

## 2013-02-18 VITALS — BP 120/72 | HR 52 | Temp 97.9°F | Resp 14 | Ht 72.0 in | Wt 206.0 lb

## 2013-02-18 DIAGNOSIS — Z23 Encounter for immunization: Secondary | ICD-10-CM

## 2013-02-18 DIAGNOSIS — J019 Acute sinusitis, unspecified: Secondary | ICD-10-CM

## 2013-02-18 DIAGNOSIS — F411 Generalized anxiety disorder: Secondary | ICD-10-CM

## 2013-02-18 MED ORDER — LORAZEPAM 1 MG PO TABS: ORAL_TABLET | ORAL | Status: DC

## 2013-02-18 MED ORDER — AMOXICILLIN-POT CLAVULANATE 875-125 MG PO TABS: 1.0000 | ORAL_TABLET | Freq: Two times a day (BID) | ORAL | Status: DC

## 2013-02-18 NOTE — Progress Notes (Signed)
This chart was scribed for Collene Gobble, MD by Caryn Bee, Medical Scribe. This patient was seen in Room/bed 11 and the patient's care was started at 9:11 AM.  Subjective:    Patient ID: Ronnie Willis, male    DOB: 01-Mar-1951, 62 y.o.   MRN: [id number]  HPI HPI Comments: Ronnie Willis is a 62 y.o. male who presents to [facility] complaining of gradual onset rhinorrhea with yellow/green discharge. Pt states he has associated chest congestion and sinus pressure. He has taken Mucinex for the symptoms with mild relief. Pt denies fever. Pt is requesting flu vaccination and prescription refills. He has been experiencing increased levels of stress and anxiety due to work and his relationship. Pt sees a therapist and does group therapy. He states these meetings help him.   Review of Systems  Constitutional: Negative for fever.  HENT: Positive for congestion, rhinorrhea and sinus pressure.    Past Surgical History  Procedure Laterality Date  . Joint replacement    . Cataract extraction      both eyes  . Eye surgery     History   Social History  . Marital Status: Divorced    Spouse Name: N/A    Number of Children: N/A  . Years of Education: N/A   Occupational History  . Not on file.   Social History Main Topics  . Smoking status: Never Smoker   . Smokeless tobacco: Not on file  . Alcohol Use: No  . Drug Use: No  . Sexual Activity: Yes    Birth Control/ Protection: None   Other Topics Concern  . Not on file   Social History Narrative  . No narrative on file   Past Medical History  Diagnosis Date  . Allergy   . Anemia   . Substance abuse   . Cataract   . Anxiety   . Depression   . Heart murmur    History reviewed. No pertinent family history. No Known Allergies      Objective:   Physical Exam  Nursing note and vitals reviewed. Constitutional: He appears well-developed and well-nourished. No distress.  Pulmonary/Chest: Effort normal and breath sounds normal. No  respiratory distress. He has no wheezes. He has no rales. He exhibits no tenderness.   There is tenderness over the maxillary sinuses. There is purulent drainage on the right.       Assessment & Plan:  Patient placed on Augmentin for his sinusitis. I did refill his Ativan with 1 further refill. He was given his flu shot today.

## 2013-02-18 NOTE — Patient Instructions (Signed)

## 2013-02-22 ENCOUNTER — Telehealth: Payer: Self-pay

## 2013-02-22 MED ORDER — HYDROCODONE-HOMATROPINE 5-1.5 MG/5ML PO SYRP: 5.0000 mL | ORAL_SOLUTION | Freq: Three times a day (TID) | ORAL | Status: DC | PRN

## 2013-02-22 NOTE — Telephone Encounter (Signed)
Printed, have asked heather, she will sign, called pt to advise

## 2013-02-22 NOTE — Telephone Encounter (Signed)
PT CALLED TO CHECK ON THE STATUS OF HIS CALL FROM THIS MORNING. REALLY WOULD LIKE A CALL BACK ASAP SINCE HE IS FEELING SO AWFUL. PLEASE CALL HIS CELL NOW AT  [phone]    [facility]

## 2013-02-22 NOTE — Telephone Encounter (Signed)
Pt seen dr Cleta Alberts on Saturday for illness and the pt is laying awake at night coughing and he is going out of town on a business trip and would like something for his cough Call back number [phone]

## 2013-02-22 NOTE — Telephone Encounter (Signed)
Okay to write a prescription for Hycodan 1 teaspoon at night for cough he can have 3 ounces.

## 2013-04-07 ENCOUNTER — Telehealth: Payer: Self-pay

## 2013-04-07 NOTE — Telephone Encounter (Signed)
Please let him know that the record his record and cannot be changed. He does have documentation about his history of substance abuse.

## 2013-04-07 NOTE — Telephone Encounter (Signed)
He can request his records. We, however can not change anything. Please see me regarding this, he is concerned about documentation regarding his past history of substance abuse. (9 yrs ago) He wants to know what we can do to help him with this. He really wants to speak to you about this. Advised him this does show in his past history , which was pulled into his recent office visit note. See me.

## 2013-04-07 NOTE — Telephone Encounter (Signed)
Dr.Daub, Pt would like to discuss what is in his in his medical record with you. He states that you know him very well and would like discuss this because he is getting a new life insurance policy and they are requiring him to have his medical records sent to them.  Best# [phone]

## 2013-04-10 ENCOUNTER — Telehealth: Payer: Self-pay | Admitting: Radiology

## 2013-04-10 NOTE — Telephone Encounter (Signed)
Patient called, he does not want his medical records sent to the life insurance company, they may be sending release, he states he does not want records to be released.

## 2013-04-10 NOTE — Telephone Encounter (Signed)
I did advise him. He wants to discuss with you, I was going to see if you perhaps could write him a letter. He has had no treatment for the substance abuse, and states it was 9 yrs ago. He does not want this to count against him when he tries to get his life insurance policy renewed.

## 2013-04-10 NOTE — Telephone Encounter (Signed)
Thank you. I have called him to advise. He indicates he does not think a letter will be helpful he states interview on phone was 40 minutes long and he was tod his Lorazepam disqualifies him anyway. He is thankful for your help, but indicates not helpful.

## 2013-04-10 NOTE — Telephone Encounter (Signed)
Ok. Message put in [place] box in case she gets a request from [employer].

## 2013-04-10 NOTE — Telephone Encounter (Signed)
He just needs to come in and sit down and discuss this with me. I certainly can write a letter in his behalf but we do not have any control over what the insurance company does

## 2013-04-17 NOTE — Telephone Encounter (Signed)
Returned payment for records to [employer] and cancelled release.

## 2013-04-28 ENCOUNTER — Other Ambulatory Visit: Payer: Self-pay | Admitting: Emergency Medicine

## 2013-05-01 ENCOUNTER — Other Ambulatory Visit: Payer: Self-pay | Admitting: Emergency Medicine

## 2013-05-02 NOTE — Telephone Encounter (Signed)
faxed

## 2013-06-11 ENCOUNTER — Other Ambulatory Visit: Payer: Self-pay | Admitting: Emergency Medicine

## 2013-06-28 ENCOUNTER — Ambulatory Visit (INDEPENDENT_AMBULATORY_CARE_PROVIDER_SITE_OTHER): Payer: BC Managed Care – PPO | Admitting: Emergency Medicine

## 2013-06-28 VITALS — BP 128/76 | HR 56 | Temp 97.6°F | Resp 18 | Ht 72.0 in | Wt 203.6 lb

## 2013-06-28 DIAGNOSIS — R300 Dysuria: Secondary | ICD-10-CM

## 2013-06-28 DIAGNOSIS — E785 Hyperlipidemia, unspecified: Secondary | ICD-10-CM

## 2013-06-28 DIAGNOSIS — J329 Chronic sinusitis, unspecified: Secondary | ICD-10-CM

## 2013-06-28 LAB — POCT CBC
GRANULOCYTE PERCENT: 61.8 % (ref 37–80)
HEMATOCRIT: 46.6 % (ref 43.5–53.7)
Hemoglobin: 15.2 g/dL (ref 14.1–18.1)
Lymph, poc: 1.9 (ref 0.6–3.4)
MCH, POC: 29.2 pg (ref 27–31.2)
MCHC: 32.6 g/dL (ref 31.8–35.4)
MCV: 89.7 fL (ref 80–97)
MID (cbc): 0.6 (ref 0–0.9)
MPV: 9.5 fL (ref 0–99.8)
PLATELET COUNT, POC: 279 10*3/uL (ref 142–424)
POC Granulocyte: 4 (ref 2–6.9)
POC LYMPH PERCENT: 29.1 %L (ref 10–50)
POC MID %: 9.1 %M (ref 0–12)
RBC: 5.2 M/uL (ref 4.69–6.13)
RDW, POC: 15 %
WBC: 6.5 10*3/uL (ref 4.6–10.2)

## 2013-06-28 LAB — POCT UA - MICROSCOPIC ONLY
BACTERIA, U MICROSCOPIC: NEGATIVE
Casts, Ur, LPF, POC: NEGATIVE
Crystals, Ur, HPF, POC: NEGATIVE
Mucus, UA: NEGATIVE
RBC, urine, microscopic: NEGATIVE
WBC, Ur, HPF, POC: NEGATIVE
YEAST UA: NEGATIVE

## 2013-06-28 LAB — POCT URINALYSIS DIPSTICK
Bilirubin, UA: NEGATIVE
Blood, UA: NEGATIVE
GLUCOSE UA: NEGATIVE
Ketones, UA: NEGATIVE
Leukocytes, UA: NEGATIVE
NITRITE UA: NEGATIVE
Protein, UA: NEGATIVE
Spec Grav, UA: 1.015
Urobilinogen, UA: 0.2
pH, UA: 5.5

## 2013-06-28 MED ORDER — LEVOTHYROXINE SODIUM 50 MCG PO TABS: ORAL_TABLET | ORAL | Status: DC

## 2013-06-28 MED ORDER — LORAZEPAM 1 MG PO TABS: ORAL_TABLET | ORAL | Status: DC

## 2013-06-28 NOTE — Progress Notes (Signed)
Subjective:    Patient ID: Ronnie Willis, male    DOB: [birth year], 63 y.o.   MRN: [id number]  This chart was scribed for [name], MD by Terressa Koyanagi, ED Scribe. This patient was seen in room 13 and the patient's care was started at 12:14PM.    DAUB, [name], MD  HPI  HPI Comments: Ronnie Willis is a 63 y.o. male who presents to the [facility] complaining of multiple issues.   First, pt complains of intermittent left lower abd pain onset a few weeks ago with a duration of two weeks. Pt states that the pain, however, has resolved as of today. Pt reports he has been doing "core training" every two days and each session is 35 minutes long and believes that may be the cause of the pain. Pt states he had a colonoscopy last year and the results were clear.  Second, Pt states he constantly "feels" like he is on the verge of developing an UTI.   Third, Pt also complains of trouble breathing through his nose and requests a referral to ENT (Dr. Wilburn Cornelia).   Pt also requests a reevaluation of whether he needs to continue with the Levothyroxine.   Pt denies any fever.     Review of Systems  Constitutional: Negative for fever, fatigue and unexpected weight change.  Eyes: Negative for visual disturbance.  Respiratory: Negative for cough, chest tightness and shortness of breath.   Cardiovascular: Negative for chest pain, palpitations and leg swelling.  Gastrointestinal: Negative for abdominal pain and blood in stool.  Neurological: Negative for dizziness, light-headedness and headaches.    Objective:  Physical Exam  CONSTITUTIONAL: Well developed/well nourished HEAD: Normocephalic/atraumatic EYES: EOMI/PERRL ENMT: Mucous membranes moist NECK: supple no meningeal signs SPINE:entire spine nontender CV: S1/S2 noted, no murmurs/rubs/gallops noted LUNGS: Lungs are clear to auscultation bilaterally, no apparent distress ABDOMEN: soft, nontender, no rebound or  guarding GU:no cva tenderness NEURO: Pt is awake/alert, moves all extremitiesx4 EXTREMITIES: pulses normal, full ROM SKIN: warm, color normal PSYCH: no abnormalities of mood noted Results for orders placed in visit on 06/28/13  POCT CBC      Result Value Ref Range   WBC 6.5  4.6 - 10.2 K/uL   Lymph, poc 1.9  0.6 - 3.4   POC LYMPH PERCENT 29.1  10 - 50 %L   MID (cbc) 0.6  0 - 0.9   POC MID % 9.1  0 - 12 %M   POC Granulocyte 4.0  2 - 6.9   Granulocyte percent 61.8  37 - 80 %G   RBC 5.20  4.69 - 6.13 M/uL   Hemoglobin 15.2  14.1 - 18.1 g/dL   HCT, POC 46.6  43.5 - 53.7 %   MCV 89.7  80 - 97 fL   MCH, POC 29.2  27 - 31.2 pg   MCHC 32.6  31.8 - 35.4 g/dL   RDW, POC 15.0     Platelet Count, POC 279  142 - 424 K/uL   MPV 9.5  0 - 99.8 fL  POCT UA - MICROSCOPIC ONLY      Result Value Ref Range   WBC, Ur, HPF, POC neg     RBC, urine, microscopic neg     Bacteria, U Microscopic neg     Mucus, UA neg     Epithelial cells, urine per micros 0-2     Crystals, Ur, HPF, POC neg     Casts, Ur, LPF, POC  neg     Yeast, UA neg     Filed Vitals:   06/28/13 1113  BP: 128/76  Pulse: 56  Temp: 97.6 F (36.4 C)  TempSrc: Oral  Resp: 18  Height: 6' (1.829 m)  Weight: 203 lb 9.6 oz (92.352 kg)  SpO2: 97%   Meds ordered this encounter  Medications  . LORazepam (ATIVAN) 1 MG tablet    Sig: take 1 tablet by mouth twice a day to three times a day if needed for STRESS    Dispense:  75 tablet    Refill:  1  . levothyroxine (SYNTHROID, LEVOTHROID) 50 MCG tablet    Sig: Take one tablet daily    Dispense:  30 tablet    Refill:  0   Assessment & Plan:  Meds were refilled. I also refilled his Synthroid but check the levels TSH and free T4. Referral made to ENT to evaluate his chronic sinus issues he I personally performed the services described in this documentation, which was scribed in my presence. The recorded information has been reviewed and is accurate.

## 2013-06-29 ENCOUNTER — Telehealth: Payer: Self-pay | Admitting: Radiology

## 2013-06-29 LAB — T4, FREE: FREE T4: 1.25 ng/dL (ref 0.80–1.80)

## 2013-06-29 LAB — TSH: TSH: 2.516 u[IU]/mL (ref 0.350–4.500)

## 2013-06-29 LAB — LIPID PANEL
Cholesterol: 267 mg/dL — ABNORMAL HIGH (ref 0–200)
HDL: 42 mg/dL (ref >39)
LDL Cholesterol: 201 mg/dL — ABNORMAL HIGH (ref 0–99)
Total CHOL/HDL Ratio: 6.4 Ratio
Triglycerides: 121 mg/dL (ref <150)
VLDL: 24 mg/dL (ref 0–40)

## 2013-06-29 LAB — URINE CULTURE
COLONY COUNT: NO GROWTH
ORGANISM ID, BACTERIA: NO GROWTH

## 2013-06-29 NOTE — Telephone Encounter (Signed)
We certainly can check them again. But his levels were significantly elevated and usually non-fasting only change his cholesterol by 6-10%. It does have a major effect on triglycerides but usually not on the LDL. Please be sure he follows up and has testing done. His LDL was significantly elevated

## 2013-06-29 NOTE — Telephone Encounter (Signed)
Pt called back. He was not fasting when he was in the office and had his lipids drawn, so therefore he feels that these results are not accurate. Pt is really leery about being put on a statin because of all the negative information he has read about these. Pt does not want to be prescribed anything at this point. He is going to make an appt at 104 with you, Dr Everlene Farrier.

## 2013-06-29 NOTE — Telephone Encounter (Signed)
Gave pt message. He is aware of this and has done a lot of research. He has a follow up appt for 10/24/13.

## 2013-08-04 ENCOUNTER — Other Ambulatory Visit: Payer: Self-pay | Admitting: Emergency Medicine

## 2013-08-07 ENCOUNTER — Other Ambulatory Visit: Payer: Self-pay

## 2013-08-07 NOTE — Telephone Encounter (Signed)
Received request for refill. 

## 2013-08-08 MED ORDER — LEVOTHYROXINE SODIUM 50 MCG PO TABS: ORAL_TABLET | ORAL | Status: DC

## 2013-08-30 ENCOUNTER — Other Ambulatory Visit: Payer: Self-pay

## 2013-08-30 NOTE — Telephone Encounter (Signed)
Pharm reqs RF of lorazepam. Pended. 

## 2013-08-31 ENCOUNTER — Telehealth: Payer: Self-pay | Admitting: *Deleted

## 2013-08-31 ENCOUNTER — Other Ambulatory Visit: Payer: Self-pay | Admitting: Radiology

## 2013-08-31 MED ORDER — LORAZEPAM 1 MG PO TABS: ORAL_TABLET | ORAL | Status: DC

## 2013-08-31 NOTE — Telephone Encounter (Signed)
Faxed prescription for Ativan to patient's pharmacy, per Dr Joseph Art.

## 2013-08-31 NOTE — Telephone Encounter (Signed)
Called in.

## 2013-09-26 ENCOUNTER — Other Ambulatory Visit: Payer: Self-pay | Admitting: Emergency Medicine

## 2013-09-27 NOTE — Telephone Encounter (Signed)
Faxed

## 2013-10-23 ENCOUNTER — Other Ambulatory Visit: Payer: Self-pay | Admitting: Emergency Medicine

## 2013-10-24 ENCOUNTER — Ambulatory Visit (INDEPENDENT_AMBULATORY_CARE_PROVIDER_SITE_OTHER): Payer: BC Managed Care – PPO | Admitting: Emergency Medicine

## 2013-10-24 ENCOUNTER — Encounter: Payer: Self-pay | Admitting: Emergency Medicine

## 2013-10-24 VITALS — BP 120/72 | HR 52 | Temp 97.7°F | Resp 16 | Ht 70.5 in | Wt 201.4 lb

## 2013-10-24 DIAGNOSIS — Z125 Encounter for screening for malignant neoplasm of prostate: Secondary | ICD-10-CM | POA: Diagnosis not present

## 2013-10-24 DIAGNOSIS — Z0000 Encounter for general adult medical examination without abnormal findings: Secondary | ICD-10-CM

## 2013-10-24 DIAGNOSIS — F32A Depression, unspecified: Secondary | ICD-10-CM

## 2013-10-24 DIAGNOSIS — R9431 Abnormal electrocardiogram [ECG] [EKG]: Secondary | ICD-10-CM

## 2013-10-24 DIAGNOSIS — G4700 Insomnia, unspecified: Secondary | ICD-10-CM | POA: Diagnosis not present

## 2013-10-24 DIAGNOSIS — F3289 Other specified depressive episodes: Secondary | ICD-10-CM | POA: Diagnosis not present

## 2013-10-24 DIAGNOSIS — F329 Major depressive disorder, single episode, unspecified: Secondary | ICD-10-CM

## 2013-10-24 DIAGNOSIS — Z23 Encounter for immunization: Secondary | ICD-10-CM

## 2013-10-24 LAB — POCT URINALYSIS DIPSTICK
Bilirubin, UA: NEGATIVE
Blood, UA: NEGATIVE
Glucose, UA: NEGATIVE
KETONES UA: NEGATIVE
LEUKOCYTES UA: NEGATIVE
NITRITE UA: NEGATIVE
PH UA: 6
Protein, UA: NEGATIVE
Spec Grav, UA: 1.01
UROBILINOGEN UA: 0.2

## 2013-10-24 LAB — COMPLETE METABOLIC PANEL WITH GFR
ALBUMIN: 4.6 g/dL (ref 3.5–5.2)
ALT: 18 U/L (ref 0–53)
AST: 13 U/L (ref 0–37)
Alkaline Phosphatase: 58 U/L (ref 39–117)
BUN: 17 mg/dL (ref 6–23)
CALCIUM: 9.3 mg/dL (ref 8.4–10.5)
CHLORIDE: 103 meq/L (ref 96–112)
CO2: 24 mEq/L (ref 19–32)
CREATININE: 1.04 mg/dL (ref 0.50–1.35)
GFR, EST AFRICAN AMERICAN: 88 mL/min
GFR, Est Non African American: 76 mL/min
Glucose, Bld: 80 mg/dL (ref 70–99)
POTASSIUM: 4.3 meq/L (ref 3.5–5.3)
Sodium: 138 mEq/L (ref 135–145)
Total Bilirubin: 1.3 mg/dL — ABNORMAL HIGH (ref 0.2–1.2)
Total Protein: 6.8 g/dL (ref 6.0–8.3)

## 2013-10-24 LAB — CBC WITH DIFFERENTIAL/PLATELET
Basophils Absolute: 0.1 10*3/uL (ref 0.0–0.1)
Basophils Relative: 1 % (ref 0–1)
EOS PCT: 3 % (ref 0–5)
Eosinophils Absolute: 0.2 10*3/uL (ref 0.0–0.7)
HCT: 43.2 % (ref 39.0–52.0)
Hemoglobin: 14.8 g/dL (ref 13.0–17.0)
LYMPHS ABS: 1.4 10*3/uL (ref 0.7–4.0)
Lymphocytes Relative: 21 % (ref 12–46)
MCH: 28.5 pg (ref 26.0–34.0)
MCHC: 34.3 g/dL (ref 30.0–36.0)
MCV: 83.2 fL (ref 78.0–100.0)
Monocytes Absolute: 0.5 10*3/uL (ref 0.1–1.0)
Monocytes Relative: 8 % (ref 3–12)
Neutro Abs: 4.5 10*3/uL (ref 1.7–7.7)
Neutrophils Relative %: 67 % (ref 43–77)
PLATELETS: 270 10*3/uL (ref 150–400)
RBC: 5.19 MIL/uL (ref 4.22–5.81)
RDW: 14.6 % (ref 11.5–15.5)
WBC: 6.7 10*3/uL (ref 4.0–10.5)

## 2013-10-24 LAB — LIPID PANEL
CHOLESTEROL: 207 mg/dL — AB (ref 0–200)
HDL: 38 mg/dL — ABNORMAL LOW (ref >39)
LDL CALC: 141 mg/dL — AB (ref 0–99)
Total CHOL/HDL Ratio: 5.4 Ratio
Triglycerides: 142 mg/dL (ref <150)
VLDL: 28 mg/dL (ref 0–40)

## 2013-10-24 LAB — IFOBT (OCCULT BLOOD): IFOBT: NEGATIVE

## 2013-10-24 LAB — TSH: TSH: 3.684 u[IU]/mL (ref 0.350–4.500)

## 2013-10-24 MED ORDER — LORAZEPAM 1 MG PO TABS: ORAL_TABLET | ORAL | Status: DC

## 2013-10-24 MED ORDER — QUETIAPINE FUMARATE 50 MG PO TABS: 50.0000 mg | ORAL_TABLET | Freq: Every day | ORAL | Status: DC

## 2013-10-24 NOTE — Progress Notes (Signed)
   Subjective:    Patient ID: Ronnie Willis, male    DOB: 1950/05/28, 63 y.o.   MRN: [id number]  HPI    Review of Systems  Constitutional: Positive for fatigue.  HENT: Positive for sinus pressure.   Eyes: Negative.   Respiratory: Negative.   Cardiovascular: Negative.   Gastrointestinal: Negative.   Endocrine: Negative.   Genitourinary: Negative.   Musculoskeletal: Negative.   Skin: Negative.   Allergic/Immunologic: Negative.   Neurological: Negative.   Hematological: Negative.   Psychiatric/Behavioral: Negative.        Objective:   Physical Exam HEENT exam is unremarkable. Neck is supple. Chest clear to auscultation and percussion. Heart regular rate and rhythm without murmurs. Abdomen soft liver spleen not enlarged. Rectal exam reveals a normal prostate no rectal masses. Extremities without cyanosis clubbing or edema  EKG shows minor nonspecific ST-T changes similar to previous EKG I have on record from 2005      Assessment & Plan:  Patient's main complaint is of fatigue. He is under a significant amount of stress regarding his former wife and financial issues. He is unhappy at work and considering on making a change. He is in a relationship which is good but strained at times. He continues to go to meetings  3 times a week. 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 Meds were refilled. He has a therapist and will discuss with her the possibility of resuming antidepressants. He continues on Ativan and Seroquel at night. Referral made to cardiology to be sure his fatigue is not secondary to cardiac disease. He was updated on his DTaP vaccine

## 2013-10-24 NOTE — Progress Notes (Signed)
   Subjective:    Patient ID: Ronnie Willis, male    DOB: 11-10-1950, 63 y.o.   MRN: [id number]  HPI    Review of Systems     Objective:   Physical Exam        Assessment & Plan:

## 2013-10-25 LAB — PSA: PSA: 2.37 ng/mL (ref <=4.00)

## 2013-10-26 ENCOUNTER — Other Ambulatory Visit: Payer: Self-pay | Admitting: Radiology

## 2013-10-26 DIAGNOSIS — E785 Hyperlipidemia, unspecified: Secondary | ICD-10-CM

## 2013-10-26 MED ORDER — ROSUVASTATIN CALCIUM 5 MG PO TABS: 5.0000 mg | ORAL_TABLET | Freq: Every day | ORAL | Status: DC

## 2013-11-02 ENCOUNTER — Encounter: Payer: Self-pay | Admitting: *Deleted

## 2013-11-02 DIAGNOSIS — E785 Hyperlipidemia, unspecified: Secondary | ICD-10-CM | POA: Insufficient documentation

## 2013-11-03 ENCOUNTER — Telehealth: Payer: Self-pay | Admitting: *Deleted

## 2013-11-03 NOTE — Telephone Encounter (Signed)
Prior Authorization approved for Crestor 5 mg tablets from 11/02/13-11/03/14.  Faxed approval to pharmacy. Sent to chart to scan

## 2013-11-04 ENCOUNTER — Other Ambulatory Visit: Payer: Self-pay | Admitting: Emergency Medicine

## 2013-11-06 NOTE — Telephone Encounter (Signed)
Dr Everlene Farrier, I don't see discussion of this med at recent [place]. OK to RF?

## 2013-11-24 ENCOUNTER — Encounter: Payer: Self-pay | Admitting: *Deleted

## 2013-11-30 ENCOUNTER — Ambulatory Visit: Payer: Self-pay | Admitting: Cardiology

## 2014-01-07 ENCOUNTER — Ambulatory Visit (INDEPENDENT_AMBULATORY_CARE_PROVIDER_SITE_OTHER): Payer: BC Managed Care – PPO | Admitting: Emergency Medicine

## 2014-01-07 ENCOUNTER — Ambulatory Visit (INDEPENDENT_AMBULATORY_CARE_PROVIDER_SITE_OTHER): Payer: BC Managed Care – PPO

## 2014-01-07 VITALS — BP 145/82 | HR 52 | Temp 97.5°F | Resp 16 | Ht 70.75 in | Wt 206.4 lb

## 2014-01-07 DIAGNOSIS — M545 Low back pain, unspecified: Secondary | ICD-10-CM

## 2014-01-07 DIAGNOSIS — L97109 Non-pressure chronic ulcer of unspecified thigh with unspecified severity: Secondary | ICD-10-CM

## 2014-01-07 DIAGNOSIS — L97129 Non-pressure chronic ulcer of left thigh with unspecified severity: Secondary | ICD-10-CM

## 2014-01-07 MED ORDER — TRAMADOL HCL 50 MG PO TABS
50.0000 mg | ORAL_TABLET | Freq: Three times a day (TID) | ORAL | Status: DC | PRN
Start: 2014-01-07 — End: 2014-06-16

## 2014-01-07 MED ORDER — MUPIROCIN 2 % EX OINT: TOPICAL_OINTMENT | CUTANEOUS | Status: DC

## 2014-01-07 NOTE — Progress Notes (Addendum)
Subjective:    Patient ID: Ronnie Willis, male    DOB: 1950-06-28, 63 y.o.   MRN: [id number] This chart was scribed for Remo Lipps A. Everlene Farrier, MD by Steva Colder, ED Scribe. The patient was seen in room 13 at 3:38 PM.   Chief Complaint  Patient presents with  . Back Pain    Hurts mostly when trying to stand up x 10 days  . Sore on left thigh    HPI Ronnie Willis is a 63 y.o. male who presents today complaining of back pain onset 10 days. He states that it hurts mostly when he tries to stand. He states that it hurts worse in the morning and the end of the day. He states that he has been on the road last week. He states that he does not think that he did it while working out. He states that he saw a chiropractor and was aligned. He states that she did not do any damage but she did not do any good. He states that he was not X-rayed. He states that he felt better for awhile. He states that he had to go out of town again this week.  He states that the pain normally flares up and goes away in 2-3 days.   He states that he also has a sore on his left thigh. He thinks that it is a spider bite. He states that he noticed it several days ago. He voices concern for what to do for the spider bite. He states that he has tried Advil for his back with no relief for his symptoms. He denies pain in legs, loss or bowel or urine, and any other associated symptoms.    Patient Active Problem List   Diagnosis Date Noted  . Hyperlipemia 11/02/2013  . Insomnia 10/24/2013  . Depression 10/24/2013  . Anxiety 01/23/2012  . H/O: substance abuse 01/23/2012   Past Medical History  Diagnosis Date  . Allergy   . Anemia   . Substance abuse   . Cataract   . Anxiety   . Depression   . Heart murmur   . Seizures   . Thyroid disease    Past Surgical History  Procedure Laterality Date  . Joint replacement    . Cataract extraction      both eyes  . Eye surgery     Allergies  Allergen Reactions  . Statins     Prior to Admission medications   Medication Sig Start Date End Date Taking? Authorizing Provider  aspirin 81 MG tablet Take 81 mg by mouth daily.   Yes Historical Provider, MD  fish oil-omega-3 fatty acids 1000 MG capsule Take 2 g by mouth daily.   Yes Historical Provider, MD  fluticasone (FLONASE) 50 MCG/ACT nasal spray instill 2 sprays into each nostril once daily   Yes Darlyne Russian, MD  levothyroxine (SYNTHROID, LEVOTHROID) 50 MCG tablet take 1 tablet by mouth once daily 08/08/13  Yes Heather M Marte, PA-C  LORazepam (ATIVAN) 1 MG tablet take 1 tablet by mouth TWO to three times a day if needed for STRESS Refill on schedule each month 10/24/13  Yes Darlyne Russian, MD  Melatonin 3 MG CAPS Take 3 mg by mouth at bedtime.   Yes Historical Provider, MD  Multiple Vitamin (MULTIVITAMIN) tablet Take 1 tablet by mouth daily.   Yes Historical Provider, MD  NEXIUM 40 MG capsule take 1 capsule by mouth once daily   Yes Darlyne Russian, MD  QUEtiapine (SEROQUEL) 50 MG tablet Take 1 tablet (50 mg total) by mouth at bedtime. NEED VISIT! 10/24/13  Yes Darlyne Russian, MD  tadalafil (CIALIS) 5 MG tablet Take 5 mg by mouth daily.   Yes Historical Provider, MD      Review of Systems  Musculoskeletal: Positive for back pain.  Skin:       Possible spider bite        Objective:   Physical Exam  Nursing note and vitals reviewed. Constitutional: He is oriented to person, place, and time. He appears well-developed and well-nourished. No distress.  HENT:  Head: Normocephalic and atraumatic.  Eyes: EOM are normal.  Neck: Neck supple.  Cardiovascular: Normal rate.   Pulmonary/Chest: Effort normal. No respiratory distress.  Musculoskeletal: Normal range of motion. He exhibits tenderness.  Tender over the lower L-Spine. DTR's 2 + knees and ankles. Negative SLR. Motor strength 5/5   Neurological: He is alert and oriented to person, place, and time.  Skin: Skin is warm and dry.  1.5 x 1.5 cm ulcerated area on  the inner of the left thigh.  Psychiatric: He has a normal mood and affect. His behavior is normal.   [facility] reading (PRIMARY) by  Dr. Everlene Farrier there is a scoliotic curve, there are degenerative changes, no significant disc narrowing.   [facility] reading (PRIMARY) by  Dr Everlene Farrier Scoliosis with deg arthritis      BP 145/82  Pulse 52  Temp(Src) 97.5 F (36.4 C) (Oral)  Resp 16  Ht 5' 10.75" (1.797 m)  Wt 206 lb 6 oz (93.611 kg)  BMI 28.99 kg/m2  SpO2 99%  Assessment & Plan:  I personally performed the services described in this documentation, which was scribed in my presence. The recorded information has been reviewed and is accurate.  Pt given Bactroban for the ulcer of his left leg. Will treat back pain with Ultram. When he returns from his trip, will make a referral to [facility] or Dr. Maryan Rued.

## 2014-01-08 NOTE — Progress Notes (Signed)
   Subjective:    Patient ID: Ronnie Willis, male    DOB: [birth year], 63 y.o.   MRN: [id number]  HPI    Review of Systems     Objective:   Physical Exam        Assessment & Plan:

## 2014-01-09 ENCOUNTER — Encounter: Payer: Self-pay | Admitting: Cardiology

## 2014-01-09 ENCOUNTER — Ambulatory Visit (INDEPENDENT_AMBULATORY_CARE_PROVIDER_SITE_OTHER): Payer: BC Managed Care – HMO | Admitting: Cardiology

## 2014-01-09 VITALS — BP 140/84 | HR 64 | Ht 72.0 in | Wt 208.0 lb

## 2014-01-09 DIAGNOSIS — E782 Mixed hyperlipidemia: Secondary | ICD-10-CM

## 2014-01-09 NOTE — Progress Notes (Signed)
HPI The patient presents for evaluation of dyslipidemia. He has no past cardiac history. He did have a stress test some years ago but apparently was normal. He otherwise does well. He exercises routinely and vigorously. With this he denies any cardiovascular symptoms. The patient denies any new symptoms such as chest discomfort, neck or arm discomfort. There has been no new shortness of breath, PND or orthopnea. There have been no reported palpitations, presyncope or syncope.  He does however have dyslipidemia I reviewed this. His LDL was 140. He has tried take statin in the past that he had muscle aches 3 days and to Lipitor. He was referred back here because of this.  Allergies  Allergen Reactions  . Statins     Current Outpatient Prescriptions  Medication Sig Dispense Refill  . aspirin 81 MG tablet Take 81 mg by mouth daily.      . fish oil-omega-3 fatty acids 1000 MG capsule Take 2 g by mouth daily.      . fluticasone (FLONASE) 50 MCG/ACT nasal spray instill 2 sprays into each nostril once daily  16 g  9  . levothyroxine (SYNTHROID, LEVOTHROID) 50 MCG tablet take 1 tablet by mouth once daily  30 tablet  4  . LORazepam (ATIVAN) 1 MG tablet take 1 tablet by mouth TWO to three times a day if needed for STRESS Refill on schedule each month  75 tablet  2  . Melatonin 3 MG CAPS Take 3 mg by mouth at bedtime.      . Multiple Vitamin (MULTIVITAMIN) tablet Take 1 tablet by mouth daily.      . mupirocin ointment (BACTROBAN) 2 % Apply to leg sore three times a day  22 g  2  . NEXIUM 40 MG capsule take 1 capsule by mouth once daily  30 capsule  5  . QUEtiapine (SEROQUEL) 50 MG tablet Take 1 tablet (50 mg total) by mouth at bedtime. NEED VISIT!  30 tablet  11  . tadalafil (CIALIS) 5 MG tablet Take 5 mg by mouth daily.      . traMADol (ULTRAM) 50 MG tablet Take 1 tablet (50 mg total) by mouth every 8 (eight) hours as needed.  20 tablet  0   No current facility-administered medications for this  visit.    Past Medical History  Diagnosis Date  . Allergy   . Anemia   . Substance abuse   . Cataract   . Anxiety   . Depression   . Heart murmur   . Seizures   . Thyroid disease     Past Surgical History  Procedure Laterality Date  . Joint replacement    . Cataract extraction      both eyes  . Eye surgery      Family History  Problem Relation Age of Onset  . Cancer Brother     History   Social History  . Marital Status: Divorced    Spouse Name: N/A    Number of Children: N/A  . Years of Education: N/A   Occupational History  . Geophysical data processor    Social History Main Topics  . Smoking status: Never Smoker   . Smokeless tobacco: Never Used  . Alcohol Use: No  . Drug Use: No  . Sexual Activity: Yes    Birth Control/ Protection: None   Other Topics Concern  . Not on file   Social History Narrative   Divorced. Education: [employer].    ROS:  Back pain.  Otherwise as stated in the HPI and negative for all other systems.  PHYSICAL EXAM BP 140/84  Pulse 64  Ht 6' (1.829 m)  Wt 208 lb (94.348 kg)  BMI 28.20 kg/m2 GENERAL:  Well appearing HEENT:  Pupils equal round and reactive, fundi not visualized, oral mucosa unremarkable NECK:  No jugular venous distention, waveform within normal limits, carotid upstroke brisk and symmetric, no bruits, no thyromegaly LYMPHATICS:  No cervical, inguinal adenopathy LUNGS:  Clear to auscultation bilaterally BACK:  No CVA tenderness CHEST:  Unremarkable HEART:  PMI not displaced or sustained,S1 and S2 within normal limits, no S3, no S4, no clicks, no rubs, no murmurs ABD:  Flat, positive bowel sounds normal in frequency in pitch, no bruits, no rebound, no guarding, no midline pulsatile mass, no hepatomegaly, no splenomegaly EXT:  2 plus pulses throughout, no edema, no cyanosis no clubbing SKIN:  No rashes no nodules NEURO:  Cranial nerves II through XII grossly intact, motor grossly intact throughout PSYCH:  Cognitively  intact, oriented to person place and time   EKG:  Sinus rhythm, rate 64, axis within normal limits, intervals within normal limits, no acute ST-T wave changes.   ASSESSMENT AND PLAN  DYSLIPIDEMIA:  He has an LDL 140. We discussed this at length. Currently, after long discussion, he wants to try diet to bring this down and it does not come down substantially he would consider at least a coronary calcium score to further guide therapy such as a statin.  HTN:  This is an aberration.  His BP is usually very good.  No change in therapy is indicated.   (Greater than 40 minutes reviewing all data with greater than 50% face to face with the patient).

## 2014-01-09 NOTE — Patient Instructions (Signed)
Your physician recommends that you schedule a follow-up appointment in: 6 months with Dr. Hochrein  

## 2014-01-25 ENCOUNTER — Other Ambulatory Visit: Payer: Self-pay | Admitting: Emergency Medicine

## 2014-01-27 ENCOUNTER — Telehealth: Payer: Self-pay

## 2014-01-27 NOTE — Telephone Encounter (Signed)
Patient is again requesting medication refill for [facility] sent to [facility] on [address]. Please call patient is needed [phone]

## 2014-01-27 NOTE — Telephone Encounter (Signed)
Rx called in and pt notified.

## 2014-02-01 ENCOUNTER — Ambulatory Visit: Payer: BC Managed Care – PPO | Admitting: Emergency Medicine

## 2014-02-20 ENCOUNTER — Ambulatory Visit (INDEPENDENT_AMBULATORY_CARE_PROVIDER_SITE_OTHER): Payer: BC Managed Care – PPO | Admitting: Internal Medicine

## 2014-02-20 VITALS — BP 118/80 | HR 63 | Temp 97.9°F | Resp 16 | Ht 71.0 in | Wt 202.0 lb

## 2014-02-20 DIAGNOSIS — J0100 Acute maxillary sinusitis, unspecified: Secondary | ICD-10-CM

## 2014-02-20 MED ORDER — BENZONATATE 100 MG PO CAPS: 100.0000 mg | ORAL_CAPSULE | Freq: Three times a day (TID) | ORAL | Status: DC | PRN

## 2014-02-20 MED ORDER — HYDROCODONE-HOMATROPINE 5-1.5 MG/5ML PO SYRP: 5.0000 mL | ORAL_SOLUTION | Freq: Four times a day (QID) | ORAL | Status: DC | PRN

## 2014-02-20 MED ORDER — AMOXICILLIN 500 MG PO CAPS: 1000.0000 mg | ORAL_CAPSULE | Freq: Two times a day (BID) | ORAL | Status: AC

## 2014-02-20 NOTE — Progress Notes (Signed)
Subjective:    Patient ID: Ronnie Willis, male    DOB: October 08, 1950, 63 y.o.   MRN: [id number] This chart was scribed for Tami Lin, MD by Marti Sleigh, Medical Scribe. This patient was seen in Room 9 and the patient's care was started a 11:29 AM.  Chief Complaint  Patient presents with  . Cough    With green mucus x 3 days  . Headache  . Facial Pain    HPI  Current Outpatient Prescriptions on File Prior to Visit  Medication Sig Dispense Refill  . aspirin 81 MG tablet Take 81 mg by mouth daily.    . fish oil-omega-3 fatty acids 1000 MG capsule Take 2 g by mouth daily.    . fluticasone (FLONASE) 50 MCG/ACT nasal spray instill 2 sprays into each nostril once daily 16 g 9  . levothyroxine (SYNTHROID, LEVOTHROID) 50 MCG tablet take 1 tablet by mouth once daily 30 tablet 4  . LORazepam (ATIVAN) 1 MG tablet take 1 tablet by mouth TWO to three times a day if needed for STRESS 75 tablet 2  . Melatonin 3 MG CAPS Take 3 mg by mouth at bedtime.    . Multiple Vitamin (MULTIVITAMIN) tablet Take 1 tablet by mouth daily.    . mupirocin ointment (BACTROBAN) 2 % Apply to leg sore three times a day 22 g 2  . NEXIUM 40 MG capsule take 1 capsule by mouth once daily 30 capsule 5  . QUEtiapine (SEROQUEL) 50 MG tablet Take 1 tablet (50 mg total) by mouth at bedtime. NEED VISIT! 30 tablet 11  . tadalafil (CIALIS) 5 MG tablet Take 5 mg by mouth daily.    . traMADol (ULTRAM) 50 MG tablet Take 1 tablet (50 mg total) by mouth every 8 (eight) hours as needed. 20 tablet 0   No current facility-administered medications on file prior to visit.    HPI Comments: Ronnie Willis is a 63 y.o. male with a hx of anxiety, depression, substance abuse and allergies who presents to [facility] complaining of a productive cough with green purulent drainage. Pt endorses subjective fever, sleep disturbance, sinus pressure, night sweats, ear congestion, and sick contacts. Pt denies HA. Pt has been using his Flonase twice per day  and clorotrimeton daily without relief of his symptoms.    Review of Systems  Constitutional: Positive for fever, chills and fatigue.  HENT: Positive for congestion and sinus pressure.   Respiratory: Positive for cough.   Psychiatric/Behavioral: Positive for sleep disturbance.       Objective:  BP 118/80 mmHg  Pulse 63  Temp(Src) 97.9 F (36.6 C) (Oral)  Resp 16  Ht 5\' 11"  (1.803 m)  Wt 202 lb (91.627 kg)  BMI 28.19 kg/m2  SpO2 98%  Physical Exam  Constitutional: He is oriented to person, place, and time. He appears well-developed and well-nourished.  HENT:  Head: Normocephalic and atraumatic.  Eyes: Pupils are equal, round, and reactive to light.  Neck: No JVD present.  Cardiovascular: Normal rate and regular rhythm.   Pulmonary/Chest: Effort normal and breath sounds normal. No respiratory distress.  Neurological: He is alert and oriented to person, place, and time.  Skin: Skin is warm and dry.  Psychiatric: He has a normal mood and affect. His behavior is normal.  Nursing note and vitals reviewed.      Assessment & Plan:   I have completed the patient encounter in its entirety as documented by the scribe, with editing by me where necessary.  Nel Stoneking P. Laney Pastor, M.D.  Acute maxillary sinusitis, recurrence not specified cough 2dary  Meds ordered this encounter  Medications  . amoxicillin (AMOXIL) 500 MG capsule    Sig: Take 2 capsules (1,000 mg total) by mouth 2 (two) times daily.    Dispense:  40 capsule    Refill:  0  . HYDROcodone-homatropine (HYCODAN) 5-1.5 MG/5ML syrup    Sig: Take 5 mLs by mouth every 6 (six) hours as needed.    Dispense:  120 mL    Refill:  0  . benzonatate (TESSALON) 100 MG capsule    Sig: Take 1 capsule (100 mg total) by mouth 3 (three) times daily as needed for cough.    Dispense:  20 capsule    Refill:  0

## 2014-02-22 ENCOUNTER — Telehealth: Payer: Self-pay | Admitting: Family Medicine

## 2014-02-22 ENCOUNTER — Telehealth: Payer: Self-pay | Admitting: Internal Medicine

## 2014-02-22 NOTE — Telephone Encounter (Signed)
Patient states that he was given a stronger version of Amoxicillin by Dr. Laney Pastor however that is not working at all for him. Patient wants to know if he can get something different called to his pharmacy. [facility].   [phone]

## 2014-02-22 NOTE — Telephone Encounter (Signed)
Pt advised to give medication a little longer. He has only been taking it for 2 days. Pt is very unhappy with this information. Advised him to increase fluids and use OTC remedies to help ease the symptoms. Pt states he will call back in 5 days and complain about this. He knows he will not be better.

## 2014-02-22 NOTE — Telephone Encounter (Signed)
Just started meds 11/3---needs more time for it to work

## 2014-02-22 NOTE — Telephone Encounter (Signed)
Patient want to know if he can get another antibiotic he is still coughing a lot headache and blowing green mucus out his nose please respond

## 2014-02-28 MED ORDER — PREDNISONE 20 MG PO TABS: ORAL_TABLET | ORAL | Status: DC

## 2014-02-28 NOTE — Telephone Encounter (Signed)
Patient called back and said that he feels better but his cough is still lingering. Patient states that the only thing to work for him in the past has been a 5 day round of Prednisone. Patient is requesting this be sent to [facility] on [address]

## 2014-02-28 NOTE — Telephone Encounter (Signed)
Rx for prednisone sent. Please advise the patient to take this in the mornings to reduce the risk of insomnia, and with food, to reduce the risk of GI side effects. Cough and congestion after sinuitis can last for several weeks. If if symptoms persist, he needs to RTC.

## 2014-02-28 NOTE — Telephone Encounter (Signed)
Pt is requesting a round of steroids to help with his sinus congestion. He was very unhappy to our offices response to a sinus infection. He feels better but thinks that more aggressive treatment would have benefited him.  Please advise.

## 2014-03-01 MED ORDER — HYDROCOD POLST-CHLORPHEN POLST 10-8 MG/5ML PO LQCR: 5.0000 mL | Freq: Two times a day (BID) | ORAL | Status: DC | PRN

## 2014-03-01 NOTE — Telephone Encounter (Signed)
I have written for Tussionex - he will have to come and pick it up.

## 2014-03-01 NOTE — Telephone Encounter (Signed)
Pt notified that this is ready for p/u 

## 2014-03-01 NOTE — Telephone Encounter (Signed)
Pt states he was up all night coughing again.  He is requesting Tussinex. Please advise.

## 2014-03-02 ENCOUNTER — Telehealth: Payer: Self-pay

## 2014-03-02 MED ORDER — HYDROCODONE-HOMATROPINE 5-1.5 MG/5ML PO SYRP: 5.0000 mL | ORAL_SOLUTION | Freq: Four times a day (QID) | ORAL | Status: DC | PRN

## 2014-03-02 NOTE — Telephone Encounter (Signed)
Pt needs refill on Hycodan for night time cough.  Please advise.

## 2014-03-02 NOTE — Telephone Encounter (Signed)
Pt says he just finished his antibiotic and still is coughing a lot at night.  Can we refill the antibiotic or can we call in something to help him rest at night?  [phone]

## 2014-03-03 NOTE — Addendum Note (Signed)
Addended by: Kem Boroughs D on: 03/03/2014 10:06 AM   Modules accepted: Orders, Medications

## 2014-03-03 NOTE — Telephone Encounter (Signed)
No--at his age he needs recheck to see if there is a pulmonary cause for his cough before retreatment

## 2014-03-03 NOTE — Telephone Encounter (Signed)
Pt picked up tussionex and did not get the hycodan.  He would like to see if he can get a refill on an antibiotic or something different because he is getting no better.

## 2014-03-04 NOTE — Telephone Encounter (Signed)
lmom to cb. 

## 2014-03-05 NOTE — Telephone Encounter (Signed)
LM for pt to RTC for recheck.

## 2014-04-21 ENCOUNTER — Other Ambulatory Visit: Payer: Self-pay | Admitting: Emergency Medicine

## 2014-04-24 NOTE — Telephone Encounter (Signed)
Faxed

## 2014-05-07 ENCOUNTER — Other Ambulatory Visit: Payer: Self-pay | Admitting: Emergency Medicine

## 2014-05-16 ENCOUNTER — Other Ambulatory Visit: Payer: Self-pay | Admitting: Emergency Medicine

## 2014-06-07 ENCOUNTER — Other Ambulatory Visit: Payer: Self-pay | Admitting: Physician Assistant

## 2014-06-16 ENCOUNTER — Ambulatory Visit (INDEPENDENT_AMBULATORY_CARE_PROVIDER_SITE_OTHER): Payer: BLUE CROSS/BLUE SHIELD | Admitting: Emergency Medicine

## 2014-06-16 VITALS — BP 140/86 | HR 80 | Temp 98.2°F | Resp 16 | Ht 71.0 in | Wt 202.6 lb

## 2014-06-16 DIAGNOSIS — F411 Generalized anxiety disorder: Secondary | ICD-10-CM

## 2014-06-16 DIAGNOSIS — G4700 Insomnia, unspecified: Secondary | ICD-10-CM

## 2014-06-16 DIAGNOSIS — E039 Hypothyroidism, unspecified: Secondary | ICD-10-CM

## 2014-06-16 DIAGNOSIS — K219 Gastro-esophageal reflux disease without esophagitis: Secondary | ICD-10-CM

## 2014-06-16 DIAGNOSIS — Z79899 Other long term (current) drug therapy: Secondary | ICD-10-CM

## 2014-06-16 LAB — POCT CBC
Granulocyte percent: 65.2 %G (ref 37–80)
HEMATOCRIT: 46.2 % (ref 43.5–53.7)
Hemoglobin: 14.8 g/dL (ref 14.1–18.1)
Lymph, poc: 1.7 (ref 0.6–3.4)
MCH, POC: 28.6 pg (ref 27–31.2)
MCHC: 32.1 g/dL (ref 31.8–35.4)
MCV: 89 fL (ref 80–97)
MID (CBC): 0.4 (ref 0–0.9)
MPV: 7.8 fL (ref 0–99.8)
POC Granulocyte: 3.8 (ref 2–6.9)
POC LYMPH %: 28.7 % (ref 10–50)
POC MID %: 6.1 % (ref 0–12)
Platelet Count, POC: 254 10*3/uL (ref 142–424)
RBC: 5.19 M/uL (ref 4.69–6.13)
RDW, POC: 14.5 %
WBC: 5.8 10*3/uL (ref 4.6–10.2)

## 2014-06-16 LAB — MAGNESIUM: Magnesium: 2.2 mg/dL (ref 1.5–2.5)

## 2014-06-16 MED ORDER — LORAZEPAM 1 MG PO TABS: 1.0000 mg | ORAL_TABLET | Freq: Three times a day (TID) | ORAL | Status: DC | PRN

## 2014-06-16 MED ORDER — QUETIAPINE FUMARATE 50 MG PO TABS: ORAL_TABLET | ORAL | Status: DC

## 2014-06-16 MED ORDER — LEVOTHYROXINE SODIUM 50 MCG PO TABS
ORAL_TABLET | ORAL | Status: DC
Start: 2014-06-16 — End: 2015-05-16

## 2014-06-16 MED ORDER — ESOMEPRAZOLE MAGNESIUM 40 MG PO CPDR
DELAYED_RELEASE_CAPSULE | ORAL | Status: DC
Start: 2014-06-16 — End: 2020-09-05

## 2014-06-16 NOTE — Progress Notes (Addendum)
Subjective:    Patient ID: Ronnie Willis, male    DOB: 1950-10-04, 64 y.o.   MRN: [id number]  HPI Chief Complaint  Patient presents with  . Medication Refill  . Follow-up    recheck back   This chart was scribed for [name], MD by Thea Alken, ED Scribe. This patient was seen in room 8 and the patient's care was started at 1:31 PM.  HPI Comments: Ronnie RICKETT is a 64 y.o. male who presents to the [facility] for a follow up. 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 Pt thought he would be making more money but is now on commission. Pt states he travels to work and stays in [place] from Sunday- Friday. Pt believes his work situation is stressing him out and is making him anxious. Pt has not been able to see therapist due to being out of states so often. He states he will ask his therapist for a referral to a facility closer to work in [place]. He notes that he has not been in a gym in over a month due to new job. Pt takes fish oil, multi vitamin, calcium, Nexium, Seroquel, ativan, synthroid, Cialis. Pt is requesting refill for Nexium, Seroquel, and ativan.   Pt also has mild back pain. Pt was seen by a chiropractor without relief to pain.   Past Medical History  Diagnosis Date  . Allergy   . Anemia   . Substance abuse   . Cataract   . Anxiety   . Depression   . Heart murmur   . Seizures   . Thyroid disease    Past Surgical History  Procedure Laterality Date  . Joint replacement      left knee  . Cataract extraction      both eyes  . Eye surgery    . Rotator cuff repair     Prior to Admission medications   Medication Sig Start Date End Date Taking? Authorizing Provider  aspirin 81 MG tablet Take 81 mg by mouth daily.   Yes Historical Provider, MD  esomeprazole (NEXIUM) 40 MG capsule Take 1 capsule (40 mg total) by mouth daily. PATIENT NEEDS CHECK UP FOR ADDITIONAL REFILLS 06/08/14  Yes Darlyne Russian, MD  fish  oil-omega-3 fatty acids 1000 MG capsule Take 2 g by mouth daily.   Yes Historical Provider, MD  fluticasone (FLONASE) 50 MCG/ACT nasal spray instill 2 sprays into each nostril once daily   Yes Darlyne Russian, MD  levothyroxine (SYNTHROID, LEVOTHROID) 50 MCG tablet Take 1 tablet (50 mcg total) by mouth daily. PATIENT NEEDS OFFICE VISIT FOR ADDITIONAL REFILLS 05/17/14  Yes Darlyne Russian, MD  LORazepam (ATIVAN) 1 MG tablet take 1 tablet by mouth TWO to three times a day if needed for STRESS 04/23/14  Yes Darlyne Russian, MD  Melatonin 3 MG CAPS Take 3 mg by mouth at bedtime.   Yes Historical Provider, MD  Multiple Vitamin (MULTIVITAMIN) tablet Take 1 tablet by mouth daily.   Yes Historical Provider, MD  mupirocin ointment (BACTROBAN) 2 % Apply to leg sore three times a day 01/07/14  Yes Darlyne Russian, MD  QUEtiapine (SEROQUEL) 50 MG tablet Take 1 tablet (50 mg total) by mouth at bedtime. NEED VISIT! 10/24/13  Yes Darlyne Russian, MD  tadalafil (CIALIS) 5 MG tablet Take 5 mg by mouth daily.   Yes Historical Provider, MD  Review of Systems  Musculoskeletal: Positive for myalgias and back pain.  Psychiatric/Behavioral: Negative for sleep disturbance. The patient is nervous/anxious.        Objective:   Physical Exam CONSTITUTIONAL: Well developed/well nourished HEAD: Normocephalic/atraumatic EYES: EOMI/PERRL ENMT: Mucous membranes moist NECK: supple no meningeal signs SPINE/BACK:entire spine nontender CV: S1/S2 noted, no murmurs/rubs/gallops noted LUNGS: Lungs are clear to auscultation bilaterally, no apparent distress ABDOMEN: soft, nontender, no rebound or guarding, bowel sounds noted throughout abdomen GU:no cva tenderness NEURO: Pt is awake/alert/appropriate, moves all extremitiesx4.  No facial droop. Good reflexes. EXTREMITIES: pulses normal/equal, full ROM SKIN: warm, color normal PSYCH: no abnormalities of mood noted, alert and oriented to situation  Assessment & Plan:  Blood was drawn meds  refilled I encouraged him to exercise I encouraged him to go to meetings. I encouraged him to see a counselor.I personally performed the services described in this documentation, which was scribed in my presence. The recorded information has been reviewed and is accurate.

## 2014-06-17 LAB — VITAMIN B12: Vitamin B-12: 415 pg/mL (ref 211–911)

## 2014-06-17 LAB — TSH: TSH: 2.349 u[IU]/mL (ref 0.350–4.500)

## 2014-06-17 LAB — FERRITIN: FERRITIN: 61 ng/mL (ref 22–322)

## 2014-06-17 LAB — T4, FREE: FREE T4: 1.24 ng/dL (ref 0.80–1.80)

## 2014-06-18 LAB — VITAMIN D 25 HYDROXY (VIT D DEFICIENCY, FRACTURES): VIT D 25 HYDROXY: 32 ng/mL (ref 30–100)

## 2014-10-01 ENCOUNTER — Ambulatory Visit (INDEPENDENT_AMBULATORY_CARE_PROVIDER_SITE_OTHER): Payer: BLUE CROSS/BLUE SHIELD

## 2014-10-01 ENCOUNTER — Ambulatory Visit (INDEPENDENT_AMBULATORY_CARE_PROVIDER_SITE_OTHER): Payer: BLUE CROSS/BLUE SHIELD | Admitting: Emergency Medicine

## 2014-10-01 VITALS — BP 118/82 | HR 67 | Temp 98.3°F | Resp 14 | Ht 71.25 in | Wt 202.0 lb

## 2014-10-01 DIAGNOSIS — J0101 Acute recurrent maxillary sinusitis: Secondary | ICD-10-CM | POA: Diagnosis not present

## 2014-10-01 DIAGNOSIS — R112 Nausea with vomiting, unspecified: Secondary | ICD-10-CM | POA: Diagnosis not present

## 2014-10-01 DIAGNOSIS — R5383 Other fatigue: Secondary | ICD-10-CM | POA: Diagnosis not present

## 2014-10-01 LAB — POCT CBC
Granulocyte percent: 63.1 %G (ref 37–80)
HCT, POC: 46.8 % (ref 43.5–53.7)
Hemoglobin: 15.5 g/dL (ref 14.1–18.1)
Lymph, poc: 2.4 (ref 0.6–3.4)
MCH: 27.9 pg (ref 27–31.2)
MCHC: 33.1 g/dL (ref 31.8–35.4)
MCV: 84.4 fL (ref 80–97)
MID (cbc): 0.3 (ref 0–0.9)
MPV: 7.7 fL (ref 0–99.8)
POC GRANULOCYTE: 4.6 (ref 2–6.9)
POC LYMPH PERCENT: 32.2 %L (ref 10–50)
POC MID %: 4.7 % (ref 0–12)
Platelet Count, POC: 280 10*3/uL (ref 142–424)
RBC: 5.54 M/uL (ref 4.69–6.13)
RDW, POC: 14.6 %
WBC: 7.3 10*3/uL (ref 4.6–10.2)

## 2014-10-01 MED ORDER — AMOXICILLIN 875 MG PO TABS: 875.0000 mg | ORAL_TABLET | Freq: Two times a day (BID) | ORAL | Status: DC

## 2014-10-01 MED ORDER — ONDANSETRON 8 MG PO TBDP: 8.0000 mg | ORAL_TABLET | Freq: Three times a day (TID) | ORAL | Status: DC | PRN

## 2014-10-01 NOTE — Patient Instructions (Signed)
Nausea, Adult Nausea is the feeling that you have an upset stomach or have to vomit. Nausea by itself is not likely a serious concern, but it may be an early sign of more serious medical problems. As nausea gets worse, it can lead to vomiting. If vomiting develops, there is the risk of dehydration.  CAUSES   Viral infections.  Food poisoning.  Medicines.  Pregnancy.  Motion sickness.  Migraine headaches.  Emotional distress.  Severe pain from any source.  Alcohol intoxication. HOME CARE INSTRUCTIONS  Get plenty of rest.  Ask your caregiver about specific rehydration instructions.  Eat small amounts of food and sip liquids more often.  Take all medicines as told by your caregiver. SEEK MEDICAL CARE IF:  You have not improved after 2 days, or you get worse.  You have a headache. SEEK IMMEDIATE MEDICAL CARE IF:   You have a fever.  You faint.  You keep vomiting or have blood in your vomit.  You are extremely weak or dehydrated.  You have dark or bloody stools.  You have severe chest or abdominal pain. MAKE SURE YOU:  Understand these instructions.  Will watch your condition.  Will get help right away if you are not doing well or get worse. Document Released: 05/14/2004 Document Revised: 12/30/2011 Document Reviewed: 12/17/2010 [facility] Patient Information 2015 [place], [place]. This information is not intended to replace advice given to you by your health care provider. Make sure you discuss any questions you have with your health care provider. Sinusitis Sinusitis is redness, soreness, and inflammation of the paranasal sinuses. Paranasal sinuses are air pockets within the bones of your face (beneath the eyes, the middle of the forehead, or above the eyes). In healthy paranasal sinuses, mucus is able to drain out, and air is able to circulate through them by way of your nose. However, when your paranasal sinuses are inflamed, mucus and air can become trapped.  This can allow bacteria and other germs to grow and cause infection. Sinusitis can develop quickly and last only a short time (acute) or continue over a long period (chronic). Sinusitis that lasts for more than 12 weeks is considered chronic.  CAUSES  Causes of sinusitis include:  Allergies.  Structural abnormalities, such as displacement of the cartilage that separates your nostrils (deviated septum), which can decrease the air flow through your nose and sinuses and affect sinus drainage.  Functional abnormalities, such as when the small hairs (cilia) that line your sinuses and help remove mucus do not work properly or are not present. SIGNS AND SYMPTOMS  Symptoms of acute and chronic sinusitis are the same. The primary symptoms are pain and pressure around the affected sinuses. Other symptoms include:  Upper toothache.  Earache.  Headache.  Bad breath.  Decreased sense of smell and taste.  A cough, which worsens when you are lying flat.  Fatigue.  Fever.  Thick drainage from your nose, which often is green and may contain pus (purulent).  Swelling and warmth over the affected sinuses. DIAGNOSIS  Your health care provider will perform a physical exam. During the exam, your health care provider may:  Look in your nose for signs of abnormal growths in your nostrils (nasal polyps).  Tap over the affected sinus to check for signs of infection.  View the inside of your sinuses (endoscopy) using an imaging device that has a light attached (endoscope). If your health care provider suspects that you have chronic sinusitis, one or more of the following tests  may be recommended:  Allergy tests.  Nasal culture. A sample of mucus is taken from your nose, sent to a lab, and screened for bacteria.  Nasal cytology. A sample of mucus is taken from your nose and examined by your health care provider to determine if your sinusitis is related to an allergy. TREATMENT  Most cases of  acute sinusitis are related to a viral infection and will resolve on their own within 10 days. Sometimes medicines are prescribed to help relieve symptoms (pain medicine, decongestants, nasal steroid sprays, or saline sprays).  However, for sinusitis related to a bacterial infection, your health care provider will prescribe antibiotic medicines. These are medicines that will help kill the bacteria causing the infection.  Rarely, sinusitis is caused by a fungal infection. In theses cases, your health care provider will prescribe antifungal medicine. For some cases of chronic sinusitis, surgery is needed. Generally, these are cases in which sinusitis recurs more than 3 times per year, despite other treatments. HOME CARE INSTRUCTIONS   Drink plenty of water. Water helps thin the mucus so your sinuses can drain more easily.  Use a humidifier.  Inhale steam 3 to 4 times a day (for example, sit in the bathroom with the shower running).  Apply a warm, moist washcloth to your face 3 to 4 times a day, or as directed by your health care provider.  Use saline nasal sprays to help moisten and clean your sinuses.  Take medicines only as directed by your health care provider.  If you were prescribed either an antibiotic or antifungal medicine, finish it all even if you start to feel better. SEEK IMMEDIATE MEDICAL CARE IF:  You have increasing pain or severe headaches.  You have nausea, vomiting, or drowsiness.  You have swelling around your face.  You have vision problems.  You have a stiff neck.  You have difficulty breathing. MAKE SURE YOU:   Understand these instructions.  Will watch your condition.  Will get help right away if you are not doing well or get worse. Document Released: 04/06/2005 Document Revised: 08/21/2013 Document Reviewed: 04/21/2011 [facility] Patient Information 2015 [place], [place]. This information is not intended to replace advice given to you by your health care  provider. Make sure you discuss any questions you have with your health care provider.

## 2014-10-01 NOTE — Progress Notes (Addendum)
Subjective:  This chart was scribed for Ronnie Jordan, MD by Thea Alken, ED Scribe. This patient was seen in room 2 and the patient's care was started at 11:17 AM.  Patient ID: Ronnie Willis, male    DOB: 11-Dec-1950, 64 y.o.   MRN: [id number]  HPI Chief Complaint  Patient presents with  . Headache    Pt returned from [place] on Thursday  . Nasal Congestion  . Fatigue  . Anorexia  . Cough    somewhat productive, non coloration, pt has been taking sudaffed  . Diarrhea    Has went away  . muscle aches   HPI Comments: Ronnie Willis is a 64 y.o. male who presents to the [facility] complaining of congestion. Pt states while working in [place] 10-9 days ago he developed cough, congestion, diarrhea and HA. He reports since returning home cough has improved but has had persistent congestion and HA with associated  fatigue, intermittent nausea, decreased appetite and muscles aches. He had 3 BM yesterday with formed stool.  Pt has had hot flashed but denies fever.  He has tried mucinex and mucinex while in [place]. He has also tried a neti pot and has been using flonase. Pt is also requesting zofran.   Past Medical History  Diagnosis Date  . Allergy   . Anemia   . Substance abuse   . Cataract   . Anxiety   . Depression   . Heart murmur   . Seizures   . Thyroid disease    Allergies  Allergen Reactions  . Lipitor [Atorvastatin] Other (See Comments)    Flu like symtoms  . Statins    Prior to Admission medications   Medication Sig Start Date End Date Taking? Authorizing Provider  aspirin 81 MG tablet Take 81 mg by mouth daily.   Yes Historical Provider, MD  esomeprazole (NEXIUM) 40 MG capsule Take 1 tablet daily 06/16/14  Yes Darlyne Russian, MD  fish oil-omega-3 fatty acids 1000 MG capsule Take 2 g by mouth daily.   Yes Historical Provider, MD  fluticasone (FLONASE) 50 MCG/ACT nasal spray instill 2 sprays into each nostril once daily   Yes Darlyne Russian, MD    levothyroxine (SYNTHROID, LEVOTHROID) 50 MCG tablet Take 1 tablet daily 06/16/14  Yes Darlyne Russian, MD  LORazepam (ATIVAN) 1 MG tablet Take 1 tablet (1 mg total) by mouth every 8 (eight) hours as needed for anxiety. 06/16/14  Yes Darlyne Russian, MD  Melatonin 3 MG CAPS Take 3 mg by mouth at bedtime.   Yes Historical Provider, MD  Multiple Vitamin (MULTIVITAMIN) tablet Take 1 tablet by mouth daily.   Yes Historical Provider, MD  QUEtiapine (SEROQUEL) 50 MG tablet 1 daily at bedtime 06/16/14  Yes Darlyne Russian, MD  tadalafil (CIALIS) 5 MG tablet Take 5 mg by mouth daily.   Yes Historical Provider, MD  mupirocin ointment (BACTROBAN) 2 % Apply to leg sore three times a day Patient not taking: Reported on 10/01/2014 01/07/14   Darlyne Russian, MD   Review of Systems  Constitutional: Positive for chills, appetite change ( decreased) and fatigue. Negative for fever.  HENT: Positive for congestion.   Respiratory: Positive for cough ( improved).   Gastrointestinal: Positive for nausea, abdominal pain and diarrhea.  Musculoskeletal: Positive for myalgias.  Neurological: Positive for headaches.    Objective:   Physical Exam CONSTITUTIONAL: Well developed/well nourished HEAD: Normocephalic/atraumatic EYES: EOMI/PERRL. Pt has had surgery on  both eyes ENMT: Mucous membranes moist bilateral nasal congestion.  NECK: supple no meningeal signs SPINE/BACK:entire spine nontender CV: S1/S2 noted, no murmurs/rubs/gallops noted LUNGS: Lungs are clear to auscultation bilaterally, no apparent distress ABDOMEN: soft, nontender, no rebound or guarding, bowel sounds noted throughout abdomen GU:no cva tenderness NEURO: Pt is awake/alert/appropriate, moves all extremitiesx4.  No facial droop.   EXTREMITIES: pulses normal/equal, full ROM SKIN: warm, color normal PSYCH: no abnormalities of mood noted, alert and oriented to situation [facility] reading (PRIMARY) by  Dr.Salihah Peckham there is an air-fluid level in the right maxillary  sinus. Results for orders placed or performed in visit on 10/01/14  POCT CBC  Result Value Ref Range   WBC 7.3 4.6 - 10.2 K/uL   Lymph, poc 2.4 0.6 - 3.4   POC LYMPH PERCENT 32.2 10 - 50 %L   MID (cbc) 0.3 0 - 0.9   POC MID % 4.7 0 - 12 %M   POC Granulocyte 4.6 2 - 6.9   Granulocyte percent 63.1 37 - 80 %G   RBC 5.54 4.69 - 6.13 M/uL   Hemoglobin 15.5 14.1 - 18.1 g/dL   HCT, POC 46.8 43.5 - 53.7 %   MCV 84.4 80 - 97 fL   MCH, POC 27.9 27 - 31.2 pg   MCHC 33.1 31.8 - 35.4 g/dL   RDW, POC 14.6 %   Platelet Count, POC 280 142 - 424 K/uL   MPV 7.7 0 - 99.8 fL   Filed Vitals:   10/01/14 1055  BP: 118/82  Pulse: 67  Temp: 98.3 F (36.8 C)  TempSrc: Oral  Resp: 14  Height: 5' 11.25" (1.81 m)  Weight: 202 lb (91.627 kg)  SpO2: 98%   Assessment & Plan:  Patient has an acute right maxillary sinusitis. I placed him on amoxicillin and gave him Zofran for nausea. He was nauseous certainly could be coming from the sinusitis. His CBC is normal. I did order a comprehensive metabolic panel.I personally performed the services described in this documentation, which was scribed in my presence. The recorded information has been reviewed and is accurate.  Ronnie Jordan, MD

## 2014-10-02 LAB — COMPLETE METABOLIC PANEL WITH GFR
ALK PHOS: 75 U/L (ref 39–117)
ALT: 17 U/L (ref 0–53)
AST: 12 U/L (ref 0–37)
Albumin: 4.8 g/dL (ref 3.5–5.2)
BILIRUBIN TOTAL: 1 mg/dL (ref 0.2–1.2)
BUN: 13 mg/dL (ref 6–23)
CHLORIDE: 101 meq/L (ref 96–112)
CO2: 24 mEq/L (ref 19–32)
CREATININE: 0.98 mg/dL (ref 0.50–1.35)
Calcium: 9.6 mg/dL (ref 8.4–10.5)
GFR, Est African American: >89 mL/min
GFR, Est Non African American: 81 mL/min
GLUCOSE: 99 mg/dL (ref 70–99)
POTASSIUM: 4.6 meq/L (ref 3.5–5.3)
Sodium: 134 mEq/L — ABNORMAL LOW (ref 135–145)
TOTAL PROTEIN: 7.1 g/dL (ref 6.0–8.3)

## 2014-10-27 ENCOUNTER — Telehealth: Payer: Self-pay

## 2014-10-27 NOTE — Telephone Encounter (Signed)
Pt states he has finished the antibodics and symptoms still has not gone away

## 2014-10-30 ENCOUNTER — Other Ambulatory Visit: Payer: Self-pay | Admitting: Emergency Medicine

## 2014-10-30 DIAGNOSIS — J0101 Acute recurrent maxillary sinusitis: Secondary | ICD-10-CM

## 2014-10-30 MED ORDER — AMOXICILLIN 875 MG PO TABS: 875.0000 mg | ORAL_TABLET | Freq: Two times a day (BID) | ORAL | Status: DC

## 2014-10-30 NOTE — Telephone Encounter (Signed)
Dr. Everlene Farrier any suggestions for pt?

## 2014-10-30 NOTE — Telephone Encounter (Signed)
Call patient and tell him I refilled his medications for another 10 days and then I would like to see him and re-x-ray his sinus

## 2014-10-31 NOTE — Telephone Encounter (Signed)
Left message letting pt know. 

## 2014-12-23 ENCOUNTER — Other Ambulatory Visit: Payer: Self-pay | Admitting: Emergency Medicine

## 2014-12-24 NOTE — Telephone Encounter (Signed)
Rx faxed

## 2014-12-28 ENCOUNTER — Other Ambulatory Visit: Payer: Self-pay | Admitting: Emergency Medicine

## 2014-12-29 NOTE — Telephone Encounter (Signed)
Patient is calling because the medication filled on the 5th but it was never received by the pharmacy.  Please fax!

## 2014-12-30 ENCOUNTER — Other Ambulatory Visit: Payer: Self-pay | Admitting: Physician Assistant

## 2014-12-30 ENCOUNTER — Other Ambulatory Visit: Payer: Self-pay | Admitting: *Deleted

## 2014-12-30 NOTE — Telephone Encounter (Signed)
Pt came to the office [facility]) to see why he has not received his medication of Ativan.  Upon searching no Rx was found

## 2014-12-30 NOTE — Telephone Encounter (Signed)
Spoke with Ronnie Willis and she gave an verbal authorization for this medication to be called into the pharmacy.  Lorazepam (Ativan) 1 mg tablet 75 tabs with 5 refills.  Rx was called into [facility] on [address]  Pt was made aware

## 2014-12-31 ENCOUNTER — Other Ambulatory Visit: Payer: Self-pay | Admitting: Emergency Medicine

## 2015-01-22 ENCOUNTER — Encounter: Payer: Self-pay | Admitting: Emergency Medicine

## 2015-05-16 ENCOUNTER — Other Ambulatory Visit: Payer: Self-pay | Admitting: Emergency Medicine

## 2015-05-26 ENCOUNTER — Ambulatory Visit (INDEPENDENT_AMBULATORY_CARE_PROVIDER_SITE_OTHER): Payer: BLUE CROSS/BLUE SHIELD | Admitting: Emergency Medicine

## 2015-05-26 VITALS — BP 130/84 | HR 62 | Temp 97.7°F | Resp 16 | Ht 72.0 in | Wt 208.4 lb

## 2015-05-26 DIAGNOSIS — E785 Hyperlipidemia, unspecified: Secondary | ICD-10-CM | POA: Diagnosis not present

## 2015-05-26 DIAGNOSIS — R9431 Abnormal electrocardiogram [ECG] [EKG]: Secondary | ICD-10-CM

## 2015-05-26 DIAGNOSIS — Z114 Encounter for screening for human immunodeficiency virus [HIV]: Secondary | ICD-10-CM

## 2015-05-26 DIAGNOSIS — Z0000 Encounter for general adult medical examination without abnormal findings: Secondary | ICD-10-CM

## 2015-05-26 DIAGNOSIS — E039 Hypothyroidism, unspecified: Secondary | ICD-10-CM

## 2015-05-26 DIAGNOSIS — Z79899 Other long term (current) drug therapy: Secondary | ICD-10-CM | POA: Diagnosis not present

## 2015-05-26 DIAGNOSIS — F411 Generalized anxiety disorder: Secondary | ICD-10-CM | POA: Diagnosis not present

## 2015-05-26 DIAGNOSIS — Z1159 Encounter for screening for other viral diseases: Secondary | ICD-10-CM | POA: Diagnosis not present

## 2015-05-26 LAB — COMPLETE METABOLIC PANEL WITH GFR
ALT: 23 U/L (ref 9–46)
AST: 15 U/L (ref 10–35)
Albumin: 4.6 g/dL (ref 3.6–5.1)
Alkaline Phosphatase: 68 U/L (ref 40–115)
BILIRUBIN TOTAL: 1.1 mg/dL (ref 0.2–1.2)
BUN: 16 mg/dL (ref 7–25)
CHLORIDE: 101 mmol/L (ref 98–110)
CO2: 26 mmol/L (ref 20–31)
CREATININE: 0.94 mg/dL (ref 0.70–1.25)
Calcium: 9.3 mg/dL (ref 8.6–10.3)
GFR, Est African American: >89 mL/min (ref >=60)
GFR, Est Non African American: 85 mL/min (ref >=60)
GLUCOSE: 97 mg/dL (ref 65–99)
Potassium: 4.3 mmol/L (ref 3.5–5.3)
SODIUM: 136 mmol/L (ref 135–146)
TOTAL PROTEIN: 7.1 g/dL (ref 6.1–8.1)

## 2015-05-26 LAB — POC MICROSCOPIC URINALYSIS (UMFC): Mucus: ABSENT

## 2015-05-26 LAB — LIPID PANEL
CHOL/HDL RATIO: 6.5 ratio — AB (ref <=5.0)
Cholesterol: 240 mg/dL — ABNORMAL HIGH (ref 125–200)
HDL: 37 mg/dL — AB (ref >=40)
LDL Cholesterol: 174 mg/dL — ABNORMAL HIGH (ref <130)
TRIGLYCERIDES: 145 mg/dL (ref <150)
VLDL: 29 mg/dL (ref <30)

## 2015-05-26 LAB — POCT URINALYSIS DIP (MANUAL ENTRY)
Bilirubin, UA: NEGATIVE
Glucose, UA: NEGATIVE
Ketones, POC UA: NEGATIVE
Leukocytes, UA: NEGATIVE
Nitrite, UA: NEGATIVE
PROTEIN UA: NEGATIVE
RBC UA: NEGATIVE
UROBILINOGEN UA: 0.2
pH, UA: 6

## 2015-05-26 LAB — HEPATITIS C ANTIBODY: HCV Ab: NEGATIVE

## 2015-05-26 LAB — HIV ANTIBODY (ROUTINE TESTING W REFLEX): HIV 1&2 Ab, 4th Generation: NONREACTIVE

## 2015-05-26 LAB — POCT CBC
GRANULOCYTE PERCENT: 65.5 % (ref 37–80)
HCT, POC: 45.8 % (ref 43.5–53.7)
Hemoglobin: 15.6 g/dL (ref 14.1–18.1)
Lymph, poc: 1.9 (ref 0.6–3.4)
MCH: 28.7 pg (ref 27–31.2)
MCHC: 34.1 g/dL (ref 31.8–35.4)
MCV: 84.2 fL (ref 80–97)
MID (cbc): 0.3 (ref 0–0.9)
MPV: 7.9 fL (ref 0–99.8)
PLATELET COUNT, POC: 243 10*3/uL (ref 142–424)
POC Granulocyte: 4.1 (ref 2–6.9)
POC LYMPH %: 30 % (ref 10–50)
POC MID %: 4.5 %M (ref 0–12)
RBC: 5.44 M/uL (ref 4.69–6.13)
RDW, POC: 14.3 %
WBC: 6.3 10*3/uL (ref 4.6–10.2)

## 2015-05-26 LAB — TSH: TSH: 2.024 u[IU]/mL (ref 0.350–4.500)

## 2015-05-26 NOTE — Patient Instructions (Signed)

## 2015-05-26 NOTE — Progress Notes (Signed)
Patient ID: Ronnie Willis, male   DOB: February 25, 1951, 65 y.o.   MRN: [id number]     By signing my name below, I, Ronnie Willis, attest that this documentation has been prepared under the direction and in the presence of [name], MD.  Electronically Signed: Zola Willis, Medical Scribe. 05/26/2015. 2:25 PM.   Chief Complaint:  Chief Complaint  Patient presents with  . Annual Exam  . abdominal Pain  . Stress  . Anxiety    HPI: Ronnie Willis is a 65 y.o. male with a history of substance abuse, anxiety and depression who reports to [facility] today complaining of increased stress due to work. Patient will be moving to [place] because the company he works at was bought out. He cannot retire now because he cannot afford it and will likely work 5 more years. He still has some debts to pay off. His girlfriend is fine with moving to [place]. Patient has been able to maintain his sobriety. He has two living children. His oldest daughter lives in [place], [place] but is currently here visiting, and his youngest daughter is a Ship broker at [employer].  Past Medical History  Diagnosis Date  . Allergy   . Anemia   . Substance abuse   . Cataract   . Anxiety   . Depression   . Heart murmur   . Seizures ([place])   . Thyroid disease    Past Surgical History  Procedure Laterality Date  . Joint replacement      left knee  . Cataract extraction      both eyes  . Eye surgery    . Rotator cuff repair     Social History   Social History  . Marital Status: Divorced    Spouse Name: N/A  . Number of Children: 2  . Years of Education: N/A   Occupational History  . Geophysical data processor    Social History Main Topics  . Smoking status: Never Smoker   . Smokeless tobacco: Never Used  . Alcohol Use: No  . Drug Use: No  . Sexual Activity: Yes    Birth Control/ Protection: None   Other Topics Concern  . None   Social History Narrative   Divorced. Education: [employer].   Family History  Problem Relation  Age of Onset  . Cancer Brother    Allergies  Allergen Reactions  . Lipitor [Atorvastatin] Other (See Comments)    Flu like symtoms  . Statins    Prior to Admission medications   Medication Sig Start Date End Date Taking? Authorizing Provider  aspirin 81 MG tablet Take 81 mg by mouth daily.   Yes Historical Provider, MD  esomeprazole (NEXIUM) 40 MG capsule Take 1 tablet daily 06/16/14  Yes Darlyne Russian, MD  fish oil-omega-3 fatty acids 1000 MG capsule Take 2 g by mouth daily.   Yes Historical Provider, MD  fluticasone (FLONASE) 50 MCG/ACT nasal spray instill 2 sprays into each nostril once daily   Yes Darlyne Russian, MD  levothyroxine (SYNTHROID, LEVOTHROID) 50 MCG tablet take 1 tablet by mouth once daily 05/17/15  Yes Darlyne Russian, MD  LORazepam (ATIVAN) 1 MG tablet take 1 tablet by mouth every 8 hours if needed for anxiety 12/24/14  Yes Darlyne Russian, MD  Melatonin 3 MG CAPS Take 3 mg by mouth at bedtime.   Yes Historical Provider, MD  Multiple Vitamin (MULTIVITAMIN) tablet Take 1 tablet by mouth daily.   Yes Historical Provider, MD  ondansetron (ZOFRAN-ODT) 8 MG disintegrating tablet Take 1 tablet (8 mg total) by mouth every 8 (eight) hours as needed for nausea. 10/01/14  Yes Darlyne Russian, MD  QUEtiapine (SEROQUEL) 50 MG tablet 1 daily at bedtime 06/16/14  Yes Darlyne Russian, MD  tadalafil (CIALIS) 5 MG tablet Take 5 mg by mouth daily.   Yes Historical Provider, MD  amoxicillin (AMOXIL) 875 MG tablet Take 1 tablet (875 mg total) by mouth 2 (two) times daily. Patient not taking: Reported on 05/26/2015 10/30/14   Darlyne Russian, MD  mupirocin ointment (BACTROBAN) 2 % Apply to leg sore three times a day Patient not taking: Reported on 10/01/2014 01/07/14   Darlyne Russian, MD     ROS: The patient denies fevers, chills, night sweats, unintentional weight loss, chest pain, palpitations, wheezing, dyspnea on exertion, nausea, vomiting, dysuria, hematuria, melena, numbness, weakness, or tingling.  he  does complain of intermittent chest tightness which she says occurs when he is stressed at work. He has not had this while he was working out. All other systems have been reviewed and were otherwise negative with the exception of those mentioned in the HPI and as above.    PHYSICAL EXAM: Filed Vitals:   05/26/15 1318  BP: 130/84  Pulse: 62  Temp: 97.7 F (36.5 C)  Resp: 16   Body mass index is 28.26 kg/(m^2).   General: Alert, no acute distress HEENT:  Normocephalic, atraumatic, oropharynx patent. Eye: Juliette Mangle [facility] Cardiovascular:  Regular rate and rhythm, no rubs murmurs or gallops.  No Carotid bruits, radial pulse intact. No pedal edema.  Respiratory: Clear to auscultation bilaterally.  No wheezes, rales, or rhonchi.  No cyanosis, no use of accessory musculature Abdominal: No organomegaly, abdomen is soft and non-tender, positive bowel sounds.  No masses. Musculoskeletal: Gait intact. No edema, tenderness Skin: No rashes. Neurologic: Facial musculature symmetric. Psychiatric: Patient acts appropriately throughout our interaction. Lymphatic: No cervical or submandibular lymphadenopathy    LABS: Results for orders placed or performed in visit on 05/26/15  POCT CBC  Result Value Ref Range   WBC 6.3 4.6 - 10.2 K/uL   Lymph, poc 1.9 0.6 - 3.4   POC LYMPH PERCENT 30.0 10 - 50 %L   MID (cbc) 0.3 0 - 0.9   POC MID % 4.5 0 - 12 %M   POC Granulocyte 4.1 2 - 6.9   Granulocyte percent 65.5 37 - 80 %G   RBC 5.44 4.69 - 6.13 M/uL   Hemoglobin 15.6 14.1 - 18.1 g/dL   HCT, POC 45.8 43.5 - 53.7 %   MCV 84.2 80 - 97 fL   MCH, POC 28.7 27 - 31.2 pg   MCHC 34.1 31.8 - 35.4 g/dL   RDW, POC 14.3 %   Platelet Count, POC 243 142 - 424 K/uL   MPV 7.9 0 - 99.8 fL  POCT urinalysis dipstick  Result Value Ref Range   Color, UA yellow yellow   Clarity, UA clear clear   Glucose, UA negative negative   Bilirubin, UA negative negative   Ketones, POC UA negative negative   Spec Grav, UA  <=1.005    Blood, UA negative negative   pH, UA 6.0    Protein Ur, POC negative negative   Urobilinogen, UA 0.2    Nitrite, UA Negative Negative   Leukocytes, UA Negative Negative  POCT Microscopic Urinalysis ([facility])  Result Value Ref Range   WBC,UR,HPF,POC None None WBC/hpf   RBC,UR,HPF,POC None None RBC/hpf  Bacteria None None, Too numerous to count   Mucus Absent Absent   Epithelial Cells, UR Per Microscopy None None, Too numerous to count cells/hpf     EKG/XRAY:   Primary read interpreted by [facility]lene Farrier at [facility].   ASSESSMENT/PLAN:  physical exam is unremarkable. He did have a T-wave inversion in aVL but otherwise no change on his cardiogram. I did make a referral for etiology for evaluation. His biggest oblem seems to be his severe stress at work.  I encouraged him to  To continue to see his thrapist. He is having to move to [place] and is very stresed about this.I personally performed the services described in this documentation, which was scribed in my presence. The recorded information has been reviewed and is accurate.   Gross sideeffects, risk and benefits, and alternatives of medications d/w patient. Patient is aware that all medications have potential sideeffects and we are unable to predict every sideeffect or drug-drug interaction that may occur.  [name] MD 05/26/2015 2:25 PM

## 2015-07-11 ENCOUNTER — Other Ambulatory Visit: Payer: Self-pay | Admitting: Emergency Medicine

## 2015-07-13 NOTE — Telephone Encounter (Signed)
Patient just had a physical, can we refill his seroquel

## 2015-07-15 ENCOUNTER — Other Ambulatory Visit: Payer: Self-pay | Admitting: Emergency Medicine

## 2015-07-17 ENCOUNTER — Telehealth: Payer: Self-pay

## 2015-07-17 NOTE — Telephone Encounter (Signed)
Ativan rx faxed

## 2015-11-04 ENCOUNTER — Other Ambulatory Visit: Payer: Self-pay | Admitting: Emergency Medicine

## 2016-01-13 ENCOUNTER — Other Ambulatory Visit: Payer: Self-pay | Admitting: Emergency Medicine

## 2016-01-15 NOTE — Telephone Encounter (Signed)
Last RF would have been due around 8/28.

## 2016-01-16 ENCOUNTER — Other Ambulatory Visit: Payer: Self-pay | Admitting: Emergency Medicine

## 2016-01-17 ENCOUNTER — Other Ambulatory Visit: Payer: Self-pay | Admitting: Physician Assistant

## 2016-01-17 NOTE — Telephone Encounter (Signed)
Called in this approved RF

## 2016-02-06 ENCOUNTER — Other Ambulatory Visit: Payer: Self-pay

## 2016-02-06 NOTE — Telephone Encounter (Signed)
Pt would like a refill on his QUEtiapine (SEROQUEL) 50 MG tablet [id number], levothyroxine (SYNTHROID, LEVOTHROID) 50 MCG tablet [id number] and LORazepam (ATIVAN) 1 MG tablet [id number] for 3 months. He has moved to [place]. And can not find a primary doctor. He would like [place] to use Pharmacy:  [facility] [place], [place]. Please advise at [phone]

## 2016-02-07 NOTE — Telephone Encounter (Signed)
Pt hasn't been seen since 05/2015 so just pended one 30 day RF, but pt is asking for 90 day RFs. Dr Everlene Farrier actually wrote a year's worth of RFs on Seroquel when her ordered this on 07/14/15, but the remaining 6 mos have just expired since it is a controlled. Please advise on all 3 med refills. Thanks!

## 2016-02-07 NOTE — Telephone Encounter (Signed)
90 days is okay on the thyroid and seroquel, Please call in one month of lorazepam.  RTC in that time to reestablish.

## 2016-02-10 MED ORDER — LORAZEPAM 1 MG PO TABS: 1.0000 mg | ORAL_TABLET | Freq: Three times a day (TID) | ORAL | 0 refills | Status: DC | PRN

## 2016-02-10 MED ORDER — LEVOTHYROXINE SODIUM 50 MCG PO TABS: 50.0000 ug | ORAL_TABLET | Freq: Every day | ORAL | 0 refills | Status: AC

## 2016-02-10 MED ORDER — QUETIAPINE FUMARATE 50 MG PO TABS: 50.0000 mg | ORAL_TABLET | Freq: Every day | ORAL | 0 refills | Status: DC

## 2016-02-10 NOTE — Telephone Encounter (Signed)
Sent and called in RFs as Ronnie Willis below. Notified pt who reported he is having an awful time trying to find a good doctor that is taking new pts in [place], but he will try to get in to see someone within the month or at least get an appt set up.

## 2016-03-11 ENCOUNTER — Ambulatory Visit (INDEPENDENT_AMBULATORY_CARE_PROVIDER_SITE_OTHER): Payer: BLUE CROSS/BLUE SHIELD | Admitting: Family Medicine

## 2016-03-11 VITALS — BP 124/82 | HR 61 | Temp 97.6°F | Resp 17 | Ht 72.0 in | Wt 209.0 lb

## 2016-03-11 DIAGNOSIS — E784 Other hyperlipidemia: Secondary | ICD-10-CM

## 2016-03-11 DIAGNOSIS — E7849 Other hyperlipidemia: Secondary | ICD-10-CM

## 2016-03-11 LAB — LIPID PANEL
CHOL/HDL RATIO: 3.6 ratio (ref <5.0)
Cholesterol: 159 mg/dL (ref <200)
HDL: 44 mg/dL (ref >40)
LDL CALC: 91 mg/dL (ref <100)
TRIGLYCERIDES: 120 mg/dL (ref <150)
VLDL: 24 mg/dL (ref <30)

## 2016-03-11 MED ORDER — LORAZEPAM 1 MG PO TABS: 1.0000 mg | ORAL_TABLET | Freq: Three times a day (TID) | ORAL | 0 refills | Status: DC | PRN

## 2016-03-11 MED ORDER — QUETIAPINE FUMARATE 50 MG PO TABS: 50.0000 mg | ORAL_TABLET | Freq: Every day | ORAL | 0 refills | Status: AC

## 2016-03-11 NOTE — Progress Notes (Signed)
KACESON BENALLY is a 65 y.o. male who presents to [facility] today for medication refill and lipid testing  1. Anxiety Medication refill - requests Ativan and quetiapine refill  - feels his anxiety is well controlled with these two agents - he follows with a therapist who he feels helps - he denies drug, etoh, cigarette use  2. Lipid testing - last lipid test in 05/2015 - was started on crestor by cardiology approximately 3-4 months ago   ROS as above.  Pertinently, no chest pain, palpitations, SOB, Fever, Chills, Abd pain, N/V/D.   PMH reviewed. Patient is a nonsmoker.   Past Medical History:  Diagnosis Date  . Allergy   . Anemia   . Anxiety   . Cataract   . Depression   . Heart murmur   . Seizures ([place])   . Substance abuse   . Thyroid disease    Past Surgical History:  Procedure Laterality Date  . CATARACT EXTRACTION     both eyes  . EYE SURGERY    . JOINT REPLACEMENT     left knee  . ROTATOR CUFF REPAIR      Medications reviewed. Current Outpatient Prescriptions  Medication Sig Dispense Refill  . aspirin 81 MG tablet Take 81 mg by mouth daily.    Marland Kitchen esomeprazole (NEXIUM) 40 MG capsule Take 1 tablet daily 30 capsule 11  . fish oil-omega-3 fatty acids 1000 MG capsule Take 2 g by mouth daily.    . fluticasone (FLONASE) 50 MCG/ACT nasal spray instill 2 sprays into each nostril once daily 16 g 9  . levothyroxine (SYNTHROID, LEVOTHROID) 50 MCG tablet Take 1 tablet (50 mcg total) by mouth daily. 90 tablet 0  . LORazepam (ATIVAN) 1 MG tablet Take 1 tablet (1 mg total) by mouth every 8 (eight) hours as needed for anxiety. 75 tablet 0  . Melatonin 3 MG CAPS Take 3 mg by mouth at bedtime.    . Multiple Vitamin (MULTIVITAMIN) tablet Take 1 tablet by mouth daily.    . mupirocin ointment (BACTROBAN) 2 % Apply to leg sore three times a day 22 g 2  . ondansetron (ZOFRAN-ODT) 8 MG disintegrating tablet Take 1 tablet (8 mg total) by mouth every 8 (eight) hours  as needed for nausea. 30 tablet 0  . QUEtiapine (SEROQUEL) 50 MG tablet Take 1 tablet (50 mg total) by mouth at bedtime. 90 tablet 0  . tadalafil (CIALIS) 5 MG tablet Take 5 mg by mouth daily.     No current facility-administered medications for this visit.      Physical Exam:  BP 124/82 (BP Location: Right Arm, Patient Position: Sitting, Cuff Size: Large)   Pulse 61   Temp 97.6 F (36.4 C) (Oral)   Resp 17   Ht 6' (1.829 m)   Wt 209 lb (94.8 kg)   SpO2 95%   BMI 28.35 kg/m  Gen:  Alert, cooperative patient who appears stated age in no acute distress.  Vital signs reviewed. HEENT: EOMI,  MMM Pulm:  Clear to auscultation bilaterally with good air movement.  No wheezes or rales noted.   Cardiac:  Regular rate and rhythm without murmur auscultated.  Good S1/S2. Abd:  Soft/nondistended/nontender.  Good bowel sounds throughout all four quadrants.  No masses noted.  Exts: Non edematous BL  LE, warm and well perfused.   Assessment and Plan: Follow up Anxiety and Repeat lipid studies 1.  Anxiety - well controlled - [place] drug data  base reviewed with no red flags seen - lorazepan refiled #75  - seroquel refilled   2 Lipid Studes - lipid panel drawn roday - discussed healthy diet and exercise  Daryle Amis A. Lincoln Brigham MD, [facility] Resident PGY-3 Pager [phone]

## 2016-03-11 NOTE — Patient Instructions (Addendum)
You were seen for medication refill and hyperlipidemia  Food Choices to Lower Your Triglycerides Triglycerides are a type of fat in your blood. High levels of triglycerides can increase the risk of heart disease and stroke. If your triglyceride levels are high, the foods you eat and your eating habits are very important. Choosing the right foods can help lower your triglycerides. What general guidelines do I need to follow?  Lose weight if you are overweight.  Limit or avoid alcohol.  Fill one half of your plate with vegetables and green salads.  Limit fruit to two servings a day. Choose fruit instead of juice.  Make one fourth of your plate whole grains. Look for the word "whole" as the first word in the ingredient list.  Fill one fourth of your plate with lean protein foods.  Enjoy fatty fish (such as salmon, mackerel, sardines, and tuna) three times a week.  Choose healthy fats.  Limit foods high in starch and sugar.  Eat more home-cooked food and less restaurant, buffet, and fast food.  Limit fried foods.  Cook foods using methods other than frying.  Limit saturated fats.  Check ingredient lists to avoid foods with partially hydrogenated oils (trans fats) in them. What foods can I eat? Grains  Whole grains, such as whole wheat or whole grain breads, crackers, cereals, and pasta. Unsweetened oatmeal, bulgur, barley, quinoa, or brown rice. Corn or whole wheat flour tortillas. Vegetables  Fresh or frozen vegetables (raw, steamed, roasted, or grilled). Green salads. Fruits  All fresh, canned (in natural juice), or frozen fruits. Meat and Other Protein Products  Ground beef (85% or leaner), grass-fed beef, or beef trimmed of fat. Skinless chicken or [place]. Ground chicken or [place]. Pork trimmed of fat. All fish and seafood. Eggs. Dried beans, peas, or lentils. Unsalted nuts or seeds. Unsalted canned or dry beans. Dairy  Low-fat dairy products, such as skim or 1% milk, 2%  or reduced-fat cheeses, low-fat ricotta or cottage cheese, or plain low-fat yogurt. Fats and Oils  Tub margarines without trans fats. Light or reduced-fat mayonnaise and salad dressings. Avocado. Safflower, olive, or canola oils. Natural peanut or almond butter. The items listed above may not be a complete list of recommended foods or beverages. Contact your dietitian for more options.  What foods are not recommended? Grains  White bread. White pasta. White rice. Cornbread. Bagels, pastries, and croissants. Crackers that contain trans fat. Vegetables  White potatoes. Corn. Creamed or fried vegetables. Vegetables in a cheese sauce. Fruits  Dried fruits. Canned fruit in light or heavy syrup. Fruit juice. Meat and Other Protein Products  Fatty cuts of meat. Ribs, chicken wings, bacon, sausage, bologna, salami, chitterlings, fatback, hot dogs, bratwurst, and packaged luncheon meats. Dairy  Whole or 2% milk, cream, half-and-half, and cream cheese. Whole-fat or sweetened yogurt. Full-fat cheeses. Nondairy creamers and whipped toppings. Processed cheese, cheese spreads, or cheese curds. Sweets and Desserts  Corn syrup, sugars, honey, and molasses. Candy. Jam and jelly. Syrup. Sweetened cereals. Cookies, pies, cakes, donuts, muffins, and ice cream. Fats and Oils  Butter, stick margarine, lard, shortening, ghee, or bacon fat. Coconut, palm kernel, or palm oils. Beverages  Alcohol. Sweetened drinks (such as sodas, lemonade, and fruit drinks or punches). The items listed above may not be a complete list of foods and beverages to avoid. Contact your dietitian for more information.  This information is not intended to replace advice given to you by your health care provider. Make sure you discuss any  questions you have with your health care provider. Document Released: 01/23/2004 Document Revised: 09/12/2015 Document Reviewed: 02/08/2013 Elsevier Interactive Patient Education  2017 [employer].    IF you received an x-ray today, you will receive an invoice from [facility]. Please contact [facility] at [phone] with questions or concerns regarding your invoice.   IF you received labwork today, you will receive an invoice from [employer]. Please contact [facility] at [phone] with questions or concerns regarding your invoice.   Our billing staff will not be able to assist you with questions regarding bills from these companies.  You will be contacted with the lab results as soon as they are available. The fastest way to get your results is to activate your My Chart account. Instructions are located on the last page of this paperwork. If you have not heard from [place] regarding the results in 2 weeks, please contact this office.

## 2016-03-13 ENCOUNTER — Telehealth: Payer: Self-pay

## 2016-03-13 NOTE — Telephone Encounter (Signed)
PATIENT IS VERY UNHAPPY WITH THE SERVICE HE RECEIVED ON Wednesday 03/11/2016. HE SAID WHEN HE CALLED TO MAKE HIS APPOINTMENT HE ASKED TO HAVE A COMPLETE PHYSICAL WITH AN MD ONLY. HE SAID HE NEVER SAW A DOCTOR, BUT INSTEAD SAW ALYSSA HANEY. HE IS 65 YEARS OLD AND HE DOES NOT UNDERSTAND WHY HIS URINE WAS NOT CHECKED OR HE DID NOT RECEIVE AND EKG. HE SAID HE STILL HAS NOT RECEIVED HIS LAB RESULTS. SHE ALSO DID NOT AGREE WITH THE MEDICATIONS THAT DR. DAUB HAS ALWAYS PRESCRIBED HIM. HE WOULD LIKE TO SPEAK TO A MANAGER AND ALSO TO GET THE RESULTS OF HIS LAB WORK. BEST PHONE [phone] (CELL)  [place]

## 2016-03-14 ENCOUNTER — Other Ambulatory Visit: Payer: Self-pay | Admitting: Emergency Medicine

## 2016-03-16 NOTE — Telephone Encounter (Signed)
Spoke with patient regarding concerns. He felt provider did not address medication regimen for anxiety and he wanted urine tested and he wanted EKG done since he is following Cardiology.  Advised Seroquel was refilled during visit by provider and he can further discuss regimen with Dr. Mingo Amber on f/u visit. Pt is also requesting medication refill Zofran as needed for nausea and would like lab results sent to My Chart; unable to view.  Will route message to Dr. Mingo Amber Please advise

## 2016-03-17 NOTE — Telephone Encounter (Signed)
Thanks Dr. Mingo Amber

## 2016-03-17 NOTE — Telephone Encounter (Signed)
Patient is wanting zofran prescription.  I didn't see any mention of this in his visit

## 2016-03-17 NOTE — Telephone Encounter (Signed)
I will contact Dr. Lincoln Brigham to ensure labs are reviewed and viewable in MyChart.  Dr. Lincoln Brigham is an MD.  I completely agree with her documentation and management.  Had the patient seen me as the supervising physician with Dr. Lincoln Brigham, I would have done the same -- the recommendations have changed so that we no longer do yearly urinalyses or EKGs unless the patient is having symptoms.    Dr. Lincoln Brigham, please review the labs if you haven't done so, to make sure they are viewable in [place].

## 2016-04-11 ENCOUNTER — Other Ambulatory Visit: Payer: Self-pay | Admitting: Student

## 2016-04-15 NOTE — Telephone Encounter (Signed)
Ronnie Willis, the sig on Rx written on 11/22 was for Q8hrs, so #90 would have lasted him a month until 12/21, but he just got #75. Do you still want to deny?

## 2016-04-15 NOTE — Telephone Encounter (Signed)
Called in a 5 day supply (#15) as approved below. Dr Tamala Julian, please review to see if you want to send in larger refill.

## 2016-04-15 NOTE — Telephone Encounter (Signed)
I am not comfortable with prescribing that much benzodiazepine.  I am going to forward to Dr. Tamala Julian. We can go ahead and call in 5 days at his current dosage to prevent withdrawal.  Philis Fendt, MS, PA-C 3:56 PM, 04/15/2016

## 2016-04-15 NOTE — Telephone Encounter (Signed)
I am seeing that he was just prescribed this  on 11/22.  Early refills of controlled substances not safe or appropriate.  Philis Fendt, MS, PA-C 8:42 AM, 04/15/2016

## 2016-04-17 NOTE — Telephone Encounter (Signed)
Upon review of chart, patient just followed up with Dr. Mingo Amber and [facility] resident on 03/11/16.  Will forward to Dr. Mingo Amber for management and decision regarding ongoing benzo.  Recent note states that refill provided of benzo, yet actually not written.

## 2016-04-17 NOTE — Telephone Encounter (Signed)
15 tabs will last until I return the first week of January.  Will handle at that time.  Thanks!  JW

## 2016-04-18 NOTE — Telephone Encounter (Signed)
Called to pharm 

## 2016-04-24 MED ORDER — LORAZEPAM 1 MG PO TABS: 1.0000 mg | ORAL_TABLET | Freq: Three times a day (TID) | ORAL | 1 refills | Status: DC | PRN

## 2016-04-24 NOTE — Telephone Encounter (Signed)
I have completed this and had it faxed in.

## 2016-04-24 NOTE — Addendum Note (Signed)
Addended byMingo Amber, Kayleen Memos on: 04/24/2016 04:54 PM   Modules accepted: Orders

## 2016-09-09 ENCOUNTER — Other Ambulatory Visit: Payer: Self-pay | Admitting: Gastroenterology

## 2016-09-09 DIAGNOSIS — R1310 Dysphagia, unspecified: Secondary | ICD-10-CM

## 2016-09-10 ENCOUNTER — Ambulatory Visit
Admission: RE | Admit: 2016-09-10 | Discharge: 2016-09-10 | Disposition: A | Discharge status: Home / Self Care | Payer: BLUE CROSS/BLUE SHIELD | Source: Ambulatory Visit | Attending: Gastroenterology | Admitting: Gastroenterology

## 2016-09-10 DIAGNOSIS — R1310 Dysphagia, unspecified: Secondary | ICD-10-CM

## 2017-11-24 ENCOUNTER — Other Ambulatory Visit: Payer: Self-pay | Admitting: Gastroenterology

## 2017-12-02 ENCOUNTER — Encounter (HOSPITAL_COMMUNITY): Payer: Self-pay

## 2017-12-06 ENCOUNTER — Encounter (HOSPITAL_COMMUNITY)
Admission: RE | Disposition: A | Discharge status: Home / Self Care | Payer: Self-pay | Source: Ambulatory Visit | Attending: Gastroenterology

## 2017-12-06 ENCOUNTER — Ambulatory Visit (HOSPITAL_COMMUNITY): Payer: BLUE CROSS/BLUE SHIELD

## 2017-12-06 ENCOUNTER — Ambulatory Visit (HOSPITAL_COMMUNITY): Payer: BLUE CROSS/BLUE SHIELD | Admitting: Anesthesiology

## 2017-12-06 ENCOUNTER — Other Ambulatory Visit: Payer: Self-pay

## 2017-12-06 ENCOUNTER — Encounter (HOSPITAL_COMMUNITY): Payer: Self-pay | Admitting: *Deleted

## 2017-12-06 ENCOUNTER — Ambulatory Visit (HOSPITAL_COMMUNITY)
Admission: RE | Admit: 2017-12-06 | Discharge: 2017-12-06 | Disposition: A | Discharge status: Home / Self Care | Payer: BLUE CROSS/BLUE SHIELD | Source: Ambulatory Visit | Attending: Gastroenterology | Admitting: Gastroenterology

## 2017-12-06 DIAGNOSIS — F419 Anxiety disorder, unspecified: Secondary | ICD-10-CM | POA: Insufficient documentation

## 2017-12-06 DIAGNOSIS — Z7982 Long term (current) use of aspirin: Secondary | ICD-10-CM | POA: Diagnosis not present

## 2017-12-06 DIAGNOSIS — E785 Hyperlipidemia, unspecified: Secondary | ICD-10-CM | POA: Insufficient documentation

## 2017-12-06 DIAGNOSIS — Z79890 Hormone replacement therapy (postmenopausal): Secondary | ICD-10-CM | POA: Insufficient documentation

## 2017-12-06 DIAGNOSIS — Z888 Allergy status to other drugs, medicaments and biological substances status: Secondary | ICD-10-CM | POA: Insufficient documentation

## 2017-12-06 DIAGNOSIS — Z7951 Long term (current) use of inhaled steroids: Secondary | ICD-10-CM | POA: Insufficient documentation

## 2017-12-06 DIAGNOSIS — Z79899 Other long term (current) drug therapy: Secondary | ICD-10-CM | POA: Diagnosis not present

## 2017-12-06 DIAGNOSIS — F329 Major depressive disorder, single episode, unspecified: Secondary | ICD-10-CM | POA: Diagnosis not present

## 2017-12-06 DIAGNOSIS — K222 Esophageal obstruction: Secondary | ICD-10-CM | POA: Diagnosis not present

## 2017-12-06 DIAGNOSIS — R1310 Dysphagia, unspecified: Secondary | ICD-10-CM | POA: Diagnosis present

## 2017-12-06 HISTORY — PX: SAVORY DILATION: SHX5439

## 2017-12-06 HISTORY — PX: ESOPHAGOGASTRODUODENOSCOPY (EGD) WITH PROPOFOL: SHX5813

## 2017-12-06 SURGERY — ESOPHAGOGASTRODUODENOSCOPY (EGD) WITH PROPOFOL
Anesthesia: Monitor Anesthesia Care

## 2017-12-06 MED ORDER — PROPOFOL 10 MG/ML IV BOLUS
INTRAVENOUS | Status: DC | PRN
  Administered 2017-12-06: 20 mg via INTRAVENOUS
  Administered 2017-12-06: 40 mg via INTRAVENOUS

## 2017-12-06 MED ORDER — PROPOFOL 10 MG/ML IV BOLUS
INTRAVENOUS | Status: AC
  Filled 2017-12-06: qty 40

## 2017-12-06 MED ORDER — PROPOFOL 500 MG/50ML IV EMUL
INTRAVENOUS | Status: DC | PRN
  Administered 2017-12-06: 100 ug/kg/min via INTRAVENOUS

## 2017-12-06 MED ORDER — LACTATED RINGERS IV SOLN
INTRAVENOUS | Status: DC
Start: 2017-12-06 — End: 2017-12-06
  Administered 2017-12-06: 08:00:00 via INTRAVENOUS

## 2017-12-06 SURGICAL SUPPLY — 15 items

## 2017-12-06 NOTE — Op Note (Signed)
[facility] Patient Name: Ronnie Willis Procedure Date: 12/06/2017 MRN: [id number] Attending MD: Nancy Fetter Dr., MD Date of Birth: 1950-04-30 CSN: [id number] Age: 67 Admit Type: Outpatient Procedure:                Upper GI endoscopy Indications:              Dysphagia, was dilated about 10 years ago. Barium                            swallow suggested stricture Providers:                Jeneen Rinks L. Grey Rakestraw Dr., MD, Carolynn Comment RN, RN,                            Charolette Child, Technician, Anne Fu CRNA,                            CRNA Referring MD:              Medicines:                Monitored Anesthesia Care Complications:            No immediate complications. Estimated Blood Loss:     Estimated blood loss: none. Procedure:                Pre-Anesthesia Assessment:                           - Prior to the procedure, a History and Physical                            was performed, and patient medications and                            allergies were reviewed. The patient's tolerance of                            previous anesthesia was also reviewed. The risks                            and benefits of the procedure and the sedation                            options and risks were discussed with the patient.                            All questions were answered, and informed consent                            was obtained. Prior Anticoagulants: The patient has                            taken no previous anticoagulant or antiplatelet  agents. ASA Grade Assessment: II - A patient with                            mild systemic disease. After reviewing the risks                            and benefits, the patient was deemed in                            satisfactory condition to undergo the procedure.                           After obtaining informed consent, the endoscope was                            passed under direct  vision. Throughout the                            procedure, the patient's blood pressure, pulse, and                            oxygen saturations were monitored continuously. The                            [id number]-H190 ([id number]) Olympus adult endoscope was                            introduced through the mouth, and advanced to the                            second part of duodenum. The upper GI endoscopy was                            accomplished without difficulty. The patient                            tolerated the procedure well. Scope In: Scope Out: Findings:      A few benign-appearing, intrinsic mild stenoses were found at the       gastroesophageal junction. The stenoses were traversed. It was really       very minimal stricture. A guidewire was placed under fluoroscopic       guidance and the scope was withdrawn. Dilation was performed with a       Savary dilator with no resistance at 14 mm and 15 mm. No blood with       dilators.      A small amount of food (residue) was found on the greater curvature of       the stomach.      The examined duodenum was normal. Impression:               - Benign-appearing esophageal stenoses. Dilated.                           - A small amount of food (residue) in the stomach.                           -  Normal examined duodenum.                           - No specimens collected. Moderate Sedation:      See anesthesia note, no moderate sedation. Recommendation:           - Patient has a contact number available for                            emergencies. The signs and symptoms of potential                            delayed complications were discussed with the                            patient. Return to normal activities tomorrow.                            Written discharge instructions were provided to the                            patient.                           - Clear liquid diet for 6 hours.                           -  Continue present medications.                           - Return to endoscopist in 1 month. Procedure Code(s):        --- Professional ---                           [phone], Esophagogastroduodenoscopy, flexible,                            transoral; with insertion of guide wire followed by                            passage of dilator(s) through esophagus over guide                            wire Diagnosis Code(s):        --- Professional ---                           K22.2, Esophageal obstruction                           R13.10, Dysphagia, unspecified CPT copyright 2017 American [employer] Association. All rights reserved. The codes documented in this report are preliminary and upon coder review may  be revised to meet current compliance requirements. Nancy Fetter Dr., MD 12/06/2017 9:23:07 AM This report has been signed electronically. Number of Addenda: 0

## 2017-12-06 NOTE — Anesthesia Preprocedure Evaluation (Signed)
Anesthesia Evaluation  Patient identified by MRN, date of birth, ID band Patient awake    Reviewed: Allergy & Precautions, H&P , NPO status , Patient's Chart, lab work & pertinent test results  Airway Mallampati: II   Neck ROM: full    Dental   Pulmonary neg pulmonary ROS,    breath sounds clear to auscultation       Cardiovascular negative cardio ROS   Rhythm:regular Rate:Normal     Neuro/Psych Seizures -,  PSYCHIATRIC DISORDERS Anxiety Depression    GI/Hepatic   Endo/Other    Renal/GU      Musculoskeletal   Abdominal   Peds  Hematology   Anesthesia Other Findings   Reproductive/Obstetrics                             Anesthesia Physical Anesthesia Plan  ASA: II  Anesthesia Plan: MAC   Post-op Pain Management:    Induction: Intravenous  PONV Risk Score and Plan: 1 and Propofol infusion and Treatment may vary due to age or medical condition  Airway Management Planned: Nasal Cannula  Additional Equipment:   Intra-op Plan:   Post-operative Plan:   Informed Consent: I have reviewed the patients History and Physical, chart, labs and discussed the procedure including the risks, benefits and alternatives for the proposed anesthesia with the patient or authorized representative who has indicated his/her understanding and acceptance.     Plan Discussed with: CRNA, Anesthesiologist and Surgeon  Anesthesia Plan Comments:         Anesthesia Quick Evaluation

## 2017-12-06 NOTE — H&P (Signed)
Subjective:   Patient is a 67 y.o. male presents with dysphagia and history of prior dilatations. Procedure including risks and benefits discussed in office.  Patient Active Problem List   Diagnosis Date Noted  . Hyperlipemia 11/02/2013  . Insomnia 10/24/2013  . Depression 10/24/2013  . Anxiety 01/23/2012  . H/O: substance abuse 01/23/2012   Past Medical History:  Diagnosis Date  . Allergy   . Anemia   . Anxiety   . Cataract   . Depression   . Heart murmur   . Seizures ([place])   . Substance abuse ([place])   . Thyroid disease     Past Surgical History:  Procedure Laterality Date  . CATARACT EXTRACTION     both eyes  . EYE SURGERY    . JOINT REPLACEMENT     left knee  . ROTATOR CUFF REPAIR      Medications Prior to Admission  Medication Sig Dispense Refill Last Dose  . aspirin 81 MG tablet Take 81 mg by mouth daily.   12/05/2017 at Unknown time  . esomeprazole (NEXIUM) 40 MG capsule Take 1 tablet daily (Patient taking differently: Take 20 mg by mouth 2 (two) times daily. ) 30 capsule 11 12/05/2017 at Unknown time  . fluticasone (FLONASE) 50 MCG/ACT nasal spray instill 2 sprays into each nostril once daily (Patient taking differently: Place 2 sprays into both nostrils 2 (two) times daily. ) 16 g 9 12/05/2017 at Unknown time  . glycopyrrolate (ROBINUL) 2 MG tablet Take 2 mg by mouth 2 (two) times daily.  0 12/05/2017 at Unknown time  . levothyroxine (SYNTHROID, LEVOTHROID) 50 MCG tablet Take 1 tablet (50 mcg total) by mouth daily. 90 tablet 0 12/05/2017 at Unknown time  . LORazepam (ATIVAN) 1 MG tablet Take 1 tablet (1 mg total) by mouth every 8 (eight) hours as needed. for anxiety (Patient taking differently: Take 1 mg by mouth 2 (two) times daily as needed for anxiety. ) 75 tablet 1 12/05/2017 at Unknown time  . Melatonin 3 MG CAPS Take 3 mg by mouth at bedtime.   12/05/2017 at Unknown time  . Multiple Vitamin (MULTIVITAMIN) tablet Take 1 tablet by mouth 4 (four) times a week.     12/05/2017 at Unknown time  . Omega-3 Fatty Acids (FISH OIL) 1200 MG CAPS Take 2,400 mg by mouth daily.   12/05/2017 at Unknown time  . ondansetron (ZOFRAN-ODT) 8 MG disintegrating tablet take 1 tablet by mouth every 8 hours if needed for nausea (Patient taking differently: Take 8 mg by mouth every 8 (eight) hours as needed for nausea or vomiting. ) 30 tablet 0 12/05/2017 at Unknown time  . QUEtiapine (SEROQUEL) 50 MG tablet Take 1 tablet (50 mg total) by mouth at bedtime. 90 tablet 0 12/05/2017 at Unknown time  . tadalafil (CIALIS) 5 MG tablet Take 5 mg by mouth daily.   12/05/2017 at Unknown time  . mupirocin ointment (BACTROBAN) 2 % Apply to leg sore three times a day (Patient not taking: Reported on 11/29/2017) 22 g 2 Completed Course at Unknown time   Allergies  Allergen Reactions  . Lipitor [Atorvastatin] Other (See Comments)    Flu like symtoms  . Statins Other (See Comments)    "Though it was going to kill me" - Can take Crestor    Social History   Tobacco Use  . Smoking status: Never Smoker  . Smokeless tobacco: Never Used  Substance Use Topics  . Alcohol use: No    Family History  Problem Relation Age of Onset  . Cancer Brother      Objective:   Patient Vitals for the past 8 hrs:  BP Temp Temp src Pulse Resp SpO2 Height Weight  12/06/17 0810 133/84 97.7 F (36.5 C) Oral 64 15 97 % 6' (1.829 m) 89.4 kg   No intake/output data recorded. No intake/output data recorded.   See MD Preop evaluation      Assessment:   1.esophageal stricture  Plan:   Will proceed with dilatation. Patient has had this done before. Risks discussed in the office and risk of bleeding and perforation discussed again with he and his family this morning

## 2017-12-06 NOTE — Transfer of Care (Signed)
Immediate Anesthesia Transfer of Care Note  Patient: Ronnie Willis  Procedure(s) Performed: Procedure(s): ESOPHAGOGASTRODUODENOSCOPY (EGD) WITH PROPOFOL (N/A) SAVORY DILATION (N/A)  Patient Location: PACU  Anesthesia Type:MAC  Level of Consciousness:  sedated, patient cooperative and responds to stimulation  Airway & Oxygen Therapy:Patient Spontanous Breathing and Patient connected to face mask oxgen  Post-op Assessment:  Report given to PACU RN and Post -op Vital signs reviewed and stable  Post vital signs:  Reviewed and stable  Last Vitals:  Vitals:   12/06/17 0810  BP: 133/84  Pulse: 64  Resp: 15  Temp: 36.5 C  SpO2: 15%    Complications: No apparent anesthesia complications

## 2017-12-06 NOTE — Discharge Instructions (Signed)
Esophagogastroduodenoscopy, Care After °Refer to this sheet in the next few weeks. These instructions provide you with information about caring for yourself after your procedure. Your health care provider may also give you more specific instructions. Your treatment has been planned according to current medical practices, but problems sometimes occur. Call your health care provider if you have any problems or questions after your procedure. °What can I expect after the procedure? °After the procedure, it is common to have: °· A sore throat. °· Nausea. °· Bloating. °· Dizziness. °· Fatigue. ° °Follow these instructions at home: °· Do not eat or drink anything until the numbing medicine (local anesthetic) has worn off and your gag reflex has returned. You will know that the local anesthetic has worn off when you can swallow comfortably. °· Do not drive for 24 hours if you received a medicine to help you relax (sedative). °· If your health care provider took a tissue sample for testing during the procedure, make sure to get your test results. This is your responsibility. Ask your health care provider or the department performing the test when your results will be ready. °· Keep all follow-up visits as told by your health care provider. This is important. °Contact a health care provider if: °· You cannot stop coughing. °· You are not urinating. °· You are urinating less than usual. °Get help right away if: °· You have trouble swallowing. °· You cannot eat or drink. °· You have throat or chest pain that gets worse. °· You are dizzy or light-headed. °· You faint. °· You have nausea or vomiting. °· You have chills. °· You have a fever. °· You have severe abdominal pain. °· You have black, tarry, or bloody stools. °This information is not intended to replace advice given to you by your health care provider. Make sure you discuss any questions you have with your health care provider. °Document Released: 03/23/2012 Document  Revised: 09/12/2015 Document Reviewed: 02/28/2015 °Elsevier Interactive Patient Education © 2018 Elsevier Inc. ° °

## 2017-12-07 ENCOUNTER — Encounter (HOSPITAL_COMMUNITY): Payer: Self-pay | Admitting: Gastroenterology

## 2017-12-07 NOTE — Anesthesia Postprocedure Evaluation (Signed)
Anesthesia Post Note  Patient: MASOUD NYCE  Procedure(s) Performed: ESOPHAGOGASTRODUODENOSCOPY (EGD) WITH PROPOFOL (N/A ) SAVORY DILATION (N/A )     Patient location during evaluation: Endoscopy Anesthesia Type: MAC Level of consciousness: awake and alert Pain management: pain level controlled Vital Signs Assessment: post-procedure vital signs reviewed and stable Respiratory status: spontaneous breathing, nonlabored ventilation, respiratory function stable and patient connected to nasal cannula oxygen Cardiovascular status: blood pressure returned to baseline and stable Postop Assessment: no apparent nausea or vomiting Anesthetic complications: no    Last Vitals:  Vitals:   12/06/17 0930 12/06/17 0940  BP: 121/76 132/77  Pulse: (!) 51 (!) 57  Resp: 14 16  Temp:    SpO2: 100% 94%    Last Pain:  Vitals:   12/06/17 0940  TempSrc:   PainSc: 0-No pain                 Shanard Treto S

## 2018-06-06 LAB — PSA: PSA: 2.4

## 2018-06-20 ENCOUNTER — Encounter: Payer: Self-pay | Admitting: Family Medicine

## 2019-01-06 ENCOUNTER — Other Ambulatory Visit: Payer: Self-pay | Admitting: Gastroenterology

## 2019-01-06 DIAGNOSIS — R1310 Dysphagia, unspecified: Secondary | ICD-10-CM

## 2019-01-13 ENCOUNTER — Ambulatory Visit
Admission: RE | Admit: 2019-01-13 | Discharge: 2019-01-13 | Disposition: A | Discharge status: Home / Self Care | Payer: BLUE CROSS/BLUE SHIELD | Source: Ambulatory Visit | Attending: Gastroenterology | Admitting: Gastroenterology

## 2019-01-13 DIAGNOSIS — R1310 Dysphagia, unspecified: Secondary | ICD-10-CM

## 2020-08-13 ENCOUNTER — Encounter: Payer: Self-pay | Admitting: Gastroenterology

## 2020-09-05 ENCOUNTER — Ambulatory Visit (INDEPENDENT_AMBULATORY_CARE_PROVIDER_SITE_OTHER): Payer: Medicare Other | Admitting: Nurse Practitioner

## 2020-09-05 ENCOUNTER — Encounter: Payer: Self-pay | Admitting: Nurse Practitioner

## 2020-09-05 VITALS — BP 134/70 | HR 66 | Ht 69.75 in | Wt 196.8 lb

## 2020-09-05 DIAGNOSIS — R194 Change in bowel habit: Secondary | ICD-10-CM

## 2020-09-05 DIAGNOSIS — K6289 Other specified diseases of anus and rectum: Secondary | ICD-10-CM

## 2020-09-05 DIAGNOSIS — K648 Other hemorrhoids: Secondary | ICD-10-CM | POA: Diagnosis not present

## 2020-09-05 MED ORDER — HYDROCORTISONE (PERIANAL) 2.5 % EX CREA: 1.0000 "application " | TOPICAL_CREAM | Freq: Every day | CUTANEOUS | 0 refills | Status: DC

## 2020-09-05 NOTE — Progress Notes (Signed)
ASSESSMENT AND PLAN    # 70 yo male with bowel changes which started in January. Since January he has been having more frequent but SOFT stools. Etiology of bowel changes unclear. He has been working out with a Clinical research associate for 6 months so increased physical activity possibly contributing factor. No alarm features such as blood in stool, weight loss, or anemia. No associated abdominal pain. TSH,CBC and CMP unremarkable.  --Bowels movement frequency has been less over the last couple of days.  I offered an earlier colonoscopy for bowel changes ( 10 year screening colonoscopy due December 2023). He has decided to monitor things for now.  --follow up with me ~ 4 weeks.   # Intermittent rectal burning. Swollen internal hemorrhoids on anoscopy, likely the cause of intermittent burning.  --Anusol cream per rectal route QHS X 10 days  # GERD. Asymptomatic on BID PPI   HISTORY OF PRESENT ILLNESS     Chief Complaint : rectal burning, bowel changes,  urinary problems  Ronnie Willis is a 70 y.o. male with a past medical history significant for hyperlipidemia, hypothryoidism, GERD. See additional PMH below.   Patient is new to the practice, self referred for bowel changes. He was previously followed by Dr. Oletta Lamas with [facility] for GERD and screening colonoscopies.   Patient describes his normal bowel pattern as about one bowel movement every few days. He has been working out with a Clinical research associate for approximately 6 months.  In January he began having daily soft bowel movements, sometimes up to 4 movements a day. More recently he has been averaging anywhere from 1 to 3 soft BMs a day. .  Bowel change cannot be attributed to changes in diet or medications changes. TSH in March was normal. He hasn't seen any blood in his stool. He hasn't been having any abdominal pain.  His weight is stable. He does complains of anorectal burning / perianal itching sometimes after bowel movements.     Patient mentions that  he sometimes gets back pain and is concerned that he could have chronic kidney disease. Recent UA and serum creatinine were normal  A friend told him that checking Cystatin C can help detect kidney disease. He complains of problems with urinary stream. He is to see Urology soon.  . Patient has chronic GERD and history of esophageal stenosis in 2019 s/p Savary dilation. GERD symptoms managed with BID PPI ( pantoprazole in the am , Nexium 20 mg in the evening)    Data Reviewed: WBC 6.6, hgb 14.4, MCV 87 BUN 15, Cr 0.95, alk 66, AST 13, ALT 16 TSH 3.7    PREVIOUS EVALUATIONS:   December 2013 colonoscopy  - [facility] for hematochezia.  --complete exam, good prep --Internal hemorrhoids  August 2019 EGD for dysphagia - [facility] --benign appearing stenosis , Savary dilation to 67m   Past Medical History:  Diagnosis Date  . Allergy   . Anemia   . Anxiety   . Atypical nevus 06/19/2003   Left Shoulder Blade - Slight to Moderate  . Atypical nevus 11/28/2002   Right Post Upper Arm - Moderate and Left Inner Scapula - Slight to Moderate  . Atypical nevus 07/19/2013   Left Cheek - Severe  . Cataract   . Depression   . Heart murmur   . High cholesterol   . [place]izures [place]   . Sleep apnea   . Substance abuse ([place]   . Thyroid disease   . UTI (urinary  tract infection)      Past Surgical History:  Procedure Laterality Date  . CATARACT EXTRACTION     both eyes  . ESOPHAGOGASTRODUODENOSCOPY (EGD) WITH PROPOFOL N/A 12/06/2017   Procedure: ESOPHAGOGASTRODUODENOSCOPY (EGD) WITH PROPOFOL;  Surgeon: Laurence Spates, MD;  Location: [facility]OSCOPY;  Service: Endoscopy;  Laterality: N/A;  . EYE SURGERY    . JOINT REPLACEMENT     left knee  . ROTATOR CUFF REPAIR    . SAVORY DILATION N/A 12/06/2017   Procedure: SAVORY DILATION;  Surgeon: Laurence Spates, MD;  Location: [facility]OSCOPY;  Service: Endoscopy;  Laterality: N/A;   Family History  Problem Relation Age of Onset  . Brain cancer Brother    . Colon cancer Neg Hx   . Esophageal cancer Neg Hx   . Pancreatic cancer Neg Hx   . Liver disease Neg Hx   . Stomach cancer Neg Hx    Social History   Tobacco Use  . Smoking status: Never Smoker  . Smokeless tobacco: Never Used  Vaping Use  . Vaping Use: Never used  Substance Use Topics  . Alcohol use: No  . Drug use: No   Current Outpatient Medications  Medication Sig Dispense Refill  . esomeprazole (NEXIUM) 20 MG capsule Take 20 mg by mouth every evening.    . gabapentin (NEURONTIN) 300 MG capsule Take 1 capsule by mouth at bedtime.    Marland Kitchen levothyroxine (SYNTHROID, LEVOTHROID) 50 MCG tablet Take 1 tablet (50 mcg total) by mouth daily. 90 tablet 0  . LORazepam (ATIVAN) 1 MG tablet Take 1 mg by mouth in the morning, at noon, in the evening, and at bedtime.    . Melatonin 5 MG CAPS Take 5 mg by mouth at bedtime.    . pantoprazole (PROTONIX) 40 MG tablet Take 1 tablet by mouth daily.    . QUEtiapine (SEROQUEL) 50 MG tablet Take 1 tablet (50 mg total) by mouth at bedtime. 90 tablet 0  . rosuvastatin (CRESTOR) 10 MG tablet Take 10 mg by mouth daily.    . tadalafil (CIALIS) 5 MG tablet Take 5 mg by mouth daily.     No current facility-administered medications for this visit.   Allergies  Allergen Reactions  . Lipitor [Atorvastatin] Other (See Comments)    Flu like symtoms     Review of Systems: Positive for anxiety, depression, arthritis, muscle pain and cramps, sleeping problems, excessive urination.  All other systems reviewed and negative except where noted in HPI.    PHYSICAL EXAM :    Wt Readings from Last 3 Encounters:  09/05/20 196 lb 12.8 oz (89.3 kg)  12/06/17 197 lb (89.4 kg)  03/11/16 209 lb (94.8 kg)    BP 134/70   Pulse 66   Ht 5' 9.75" (1.772 m)   Wt 196 lb 12.8 oz (89.3 kg)   SpO2 97%   BMI 28.44 kg/m  Constitutional:  Pleasant well developed male in no acute distress. Psychiatric: Normal mood and affect. Behavior is normal. EENT: Pupils normal.   Conjunctivae are normal. No scleral icterus. Neck supple.  Cardiovascular: Normal rate, regular rhythm. No edema Pulmonary/chest: Effort normal and breath sounds normal. No wheezing, rales or rhonchi. Abdominal: Soft, nondistended, nontender. Bowel sounds active throughout. There are no masses palpable. No hepatomegaly. Neurological: Alert and oriented to person place and time. Skin: Skin is warm and dry. No rashes noted.  Tye Savoy, NP  09/05/2020, 9:56 AM

## 2020-09-05 NOTE — Patient Instructions (Addendum)
We have sent the following medications to your pharmacy for you to pick up at your convenience: Anusol Cream   Use Anusol Cream at bedtime for 10 days.   If you are age 71 or older, your body mass index should be between 23-30. Your Body mass index is 28.44 kg/m. If this is out of the aforementioned range listed, please consider follow up with your Primary Care Provider.  Please keep follow-up in 4-6 weeks with Nevin Bloodgood :  10/03/20 @ 10:00am  Thank you for choosing me and [id number].  Tye Savoy NP

## 2020-09-06 NOTE — Progress Notes (Signed)
Agree with assessment and plan as outlined.  

## 2020-09-18 ENCOUNTER — Ambulatory Visit: Payer: BC Managed Care – PPO | Admitting: Gastroenterology

## 2020-09-30 ENCOUNTER — Telehealth: Payer: Self-pay | Admitting: Gastroenterology

## 2020-09-30 NOTE — Telephone Encounter (Signed)
Ok with you Dr. Ardis Hughs?

## 2020-09-30 NOTE — Telephone Encounter (Signed)
Yes that's okay with me if okay with Dr. Ardis Hughs

## 2020-09-30 NOTE — Telephone Encounter (Signed)
Good afternoon Dr. Havery Moros, patient had an appt with APP where you signed off on visit but patient initially requested to see Dr. Ardis Hughs.  Ok to transfer care?  Please advise.

## 2020-10-03 ENCOUNTER — Ambulatory Visit: Payer: Medicare Other | Admitting: Nurse Practitioner

## 2020-10-06 NOTE — Telephone Encounter (Signed)
That is ok with me  

## 2020-10-07 NOTE — Telephone Encounter (Signed)
Thank you both.  Called patient to inform.  Will call when ready to schedule.

## 2020-12-24 ENCOUNTER — Ambulatory Visit (INDEPENDENT_AMBULATORY_CARE_PROVIDER_SITE_OTHER): Payer: Medicare Other | Admitting: Family Medicine

## 2020-12-24 ENCOUNTER — Other Ambulatory Visit: Payer: Self-pay

## 2020-12-24 ENCOUNTER — Ambulatory Visit: Payer: Self-pay

## 2020-12-24 ENCOUNTER — Ambulatory Visit (INDEPENDENT_AMBULATORY_CARE_PROVIDER_SITE_OTHER): Payer: Medicare Other

## 2020-12-24 VITALS — BP 130/92 | HR 62 | Ht 69.75 in | Wt 187.6 lb

## 2020-12-24 DIAGNOSIS — G8929 Other chronic pain: Secondary | ICD-10-CM

## 2020-12-24 DIAGNOSIS — M545 Low back pain, unspecified: Secondary | ICD-10-CM

## 2020-12-24 DIAGNOSIS — M25561 Pain in right knee: Secondary | ICD-10-CM | POA: Diagnosis not present

## 2020-12-24 NOTE — Progress Notes (Signed)
I, Ronnie Willis, LAT, ATC acting as a scribe for Ronnie Leader, MD.  Subjective:    CC: R buttock pain LBP  HPI: Pt is a 70 y/o male c/o buttock pain x a few months after a long drive in the car.  Pt locates pain to deep within the R buttock and bilat across low back. Pt notes he has had a L TKR.  Radiating pain: yes- down R leg LE numbness/tingling: yes LE weakness: yes- slight Aggravates: driving in a car, walking Treatments tried: stretching, tylenol, IBU  Pt also c/o R knee pain x a few months. Pt locates pain to different areas over the anterior aspect of his R knee.  He notes significant clunking and catching in his knee.  Pain is located in the anterior and medial knee.  He denies any injury.  He has been attempting some home exercises taught by his PCP including stretching and strengthening over the last few months.  He exercises a few times a week.  This has not helped much. Patient had an x-ray that [facility] that was reportedly normal.  Additionally he had a steroid injection in his right knee about 2 months ago that did not help much.  R knee swelling: yes Mechanical symptoms: yes Aggravates: walking Treatments tried: compression sleeve  Dx imaging: 01/07/14 L-spine XR  Pertinent review of Systems: No fevers or chills  Relevant historical information: Insomnia anxiety.  History left TKR.  Dr. Maureen Ralphs   Objective:    Vitals:   12/24/20 1312  BP: (!) 130/92  Pulse: 62  SpO2: 97%   General: Well Developed, well nourished, and in no acute distress.   MSK: L-spine normal appearing Nontender midline. Mild to palpation lumbar paraspinal musculature. Normal lumbar motion Lower extremity strength is intact. Minimally positive right-sided slump test.  Right knee mild effusion otherwise normal-appearing Normal motion with crepitation. Tender palpation medial joint line. Laxity to MCL stress test with some pain and some laxity to LCL stress test with  some pain. No laxity to anterior or posterior drawer test. Positive medial McMurray's test with palpable click and clunk. Negative lateral McMurray's test. Intact strength.     Lab and Radiology Results  X-ray images L-spine obtained today personally and independently interpreted. 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  No acute fractures. Await formal radiology review    Impression and Recommendations:    Assessment and Plan: 70 y.o. male with right knee pain with mechanical symptoms concerning for medial meniscus tear.  X-ray completed at [facility] and reportedly did not have much arthritis.  Based on his mechanical symptoms and lack of improvement with trials of conservative management including steroid injection and home exercise program over the last 3 months with his PCP and with orthopedics next step would be MRI to further characterize cause of pain and for potential surgical planning or injection planning.  Recheck after MRI.  Chronic low back pain: Pain is primarily located low back and without much radicular symptoms.  This is a lesser issue for him today.  He should be a good candidate for physical therapy however he like to wait until after his knee MRI before he dedicates to PT as PT may also help his knee some..  Recheck after knee MRI to determine next steps for L-spine.Marland Kitchen  PDMP not reviewed this encounter. Orders Placed This Encounter  Procedures   DG Lumbar Spine 2-3 Views    Standing Status:   Future    Number of  Occurrences:   1    Standing Expiration Date:   12/24/2021    Order Specific Question:   Reason for Exam (SYMPTOM  OR DIAGNOSIS REQUIRED)    Answer:   eval low back pain    Order Specific Question:   Preferred imaging location?    Answer:   [facility]   MR Knee Right Wo Contrast    Standing Status:   Future    Standing Expiration Date:   12/24/2021    Order Specific Question:   What is the patient's sedation requirement?    Answer:    No Sedation    Order Specific Question:   Does the patient have a pacemaker or implanted devices?    Answer:   No    Order Specific Question:   Preferred imaging location?    Answer:   [facility]process development [facility] (table limit-350lbs)   No orders of the defined types were placed in this encounter.   Discussed warning signs or symptoms. Please see discharge instructions. Patient expresses understanding.   The above documentation has been reviewed and is accurate and complete Ronnie Willis, M.D.

## 2020-12-24 NOTE — Patient Instructions (Addendum)
Thank you for coming in today.   You should hear from MRI scheduling within 1 week. If you do not hear please let me know.    Please get an Xray today before you leave   Recheck after the MRI.   Meniscus Tear A meniscus tear is a knee injury that happens when a piece of the meniscus is torn. The meniscus is a thick, rubbery, wedge-shaped piece of cartilage in the knee. Each knee has two menisci sitting between the upper bone (femur) and lower bone (tibia) that form the knee joint. Each meniscus acts as a shock absorber for the knee. A torn meniscus is a common knee injury, ranging from mild to severe. Surgery may be needed to repair a severe tear. What are the causes? This condition may be caused by kneeling, squatting, twisting, or pivoting movements. Sports-related injuries are the most common cause, often resulting from: Running and stopping suddenly. Changing direction. Being tackled or knocked off your feet. Lifting or carrying heavy weights. As people get older, their menisci get thinner and weaker. Tears can happen more easily in older people, for example, when climbing stairs. What increases the risk? You are more likely to develop this condition if you: Play contact sports. Have a job that requires kneeling or squatting. Are male. Are over 4 years old. What are the signs or symptoms? Symptoms of this condition include: Knee pain, especially at the side of the knee joint. You may feel pain immediately after injury, or hear a pop and feel pain later. A feeling that your knee is clicking, catching, locking, or giving way (weakness, instability). Not being able to fully bend or extend your knee. Bruising or swelling in your knee. How is this diagnosed? This condition may be diagnosed based on your symptoms and a physical exam. You may also have tests, such as: X-rays. MRI. Arthroscopy. This is a procedure to look inside your knee with a narrow surgical telescope. You may be  referred to a knee specialist (orthopedic surgeon). How is this treated? Treatment for this injury depends on the severity of the tear. Treatment for a mild tear may include: Rest. Medicine to reduce pain and swelling, usually a nonsteroidal anti-inflammatory drug (NSAID), like ibuprofen. A knee brace, sleeve, or wrap. Using crutches or a walker to keep weight off your knee and help with walking. Exercises to strengthen your knee (physical therapy). You may need surgery if you have a severe tear or if other treatments fail. Follow these instructions at home: If you have a brace, sleeve, or wrap: Wear it, as told by your health care provider. Remove it, only as told by your health care provider. Loosen the brace, sleeve, or wrap if your toes tingle, become numb, or turn cold and blue. Keep the brace, sleeve, or wrap clean. If the brace, sleeve, or wrap is not waterproof: Do not let it get wet. Cover it with a watertight covering when you take a bath or shower. Managing pain, stiffness, and swelling  Take over-the-counter and prescription medicines only as told by your health care provider. If directed, put ice on your knee. To do this: If you have a removable brace, sleeve, or wrap, remove it as told by your health care provider. Put ice in a plastic bag. Place a towel between your skin and the bag. Leave the ice on for 20 minutes, 2-3 times per day. Remove the ice if your skin turns bright red. This is very important. If you cannot feel  pain, heat, or cold, you have a greater risk of damage to the area. Move your toes often to reduce stiffness and swelling. Raise (elevate) the injured area above the level of your heart while you are sitting or lying down. Activity Do not use the injured limb to support your body weight until your health care provider says that you can. Use crutches or a walker as told by your health care provider. Return to your normal activities as told by your health  care provider. Ask your health care provider what activities are safe for you. Perform range-of-motion exercises only as told by your health care provider. Begin doing exercises to strengthen your knee and leg muscles only as told by your health care provider. After you recover, your health care provider may recommend these exercises to help prevent another injury. General instructions Use a knee brace, sleeve, or wrap as told by your health care provider. Ask your health care provider when it is safe to drive if you have a brace, sleeve, or wrap on your knee. Do not use any products that contain nicotine or tobacco, such as cigarettes, e-cigarettes, and chewing tobacco. If you need help quitting, ask your health care provider. Ask your health care provider if the medicine prescribed to you: Requires you to avoid driving or using heavy machinery. Can cause constipation. You may need to take these actions to prevent or treat constipation: Drink enough fluid to keep your urine pale yellow. Take over-the-counter or prescription medicines. Eat foods that are high in fiber, such as beans, whole grains, and fresh fruits and vegetables. Limit foods that are high in fat and processed sugars, such as fried or sweet foods. Keep all follow-up visits. This is important. Contact a health care provider if: You have a fever. Your knee becomes red, tender, or swollen. Your pain medicine is not controlling your pain. Your symptoms get worse or do not improve after 2 weeks of home care. Summary A meniscus tear is a knee injury that happens when a piece of the meniscus is torn. Treatment for this injury depends on the severity of the tear. You may need surgery if you have a severe tear or if other treatments fail. Rest, ice, and raise (elevate) your injured knee, as told by your health care provider. This will help lessen pain and swelling. Contact a health care provider if you have new symptoms, your symptoms  worsen, or they do not improve after 2 weeks of home care. Keep all follow-up visits. This is important. This information is not intended to replace advice given to you by your health care provider. Make sure you discuss any questions you have with your health care provider. Document Revised: 08/17/2019 Document Reviewed: 08/17/2019 Elsevier Patient Education  [place].

## 2020-12-25 NOTE — Progress Notes (Signed)
Lumbar spine x-ray shows multilevel arthritis changes in the low back as well as some scoliosis.

## 2020-12-28 ENCOUNTER — Other Ambulatory Visit: Payer: Self-pay

## 2020-12-28 ENCOUNTER — Ambulatory Visit (INDEPENDENT_AMBULATORY_CARE_PROVIDER_SITE_OTHER): Payer: Medicare Other

## 2020-12-28 DIAGNOSIS — M25561 Pain in right knee: Secondary | ICD-10-CM | POA: Diagnosis not present

## 2020-12-28 DIAGNOSIS — G8929 Other chronic pain: Secondary | ICD-10-CM | POA: Diagnosis not present

## 2020-12-30 ENCOUNTER — Ambulatory Visit (INDEPENDENT_AMBULATORY_CARE_PROVIDER_SITE_OTHER): Payer: Medicare Other | Admitting: Dermatology

## 2020-12-30 ENCOUNTER — Other Ambulatory Visit: Payer: Self-pay

## 2020-12-30 DIAGNOSIS — L814 Other melanin hyperpigmentation: Secondary | ICD-10-CM | POA: Diagnosis not present

## 2020-12-30 DIAGNOSIS — Z1283 Encounter for screening for malignant neoplasm of skin: Secondary | ICD-10-CM | POA: Diagnosis not present

## 2020-12-30 DIAGNOSIS — D1801 Hemangioma of skin and subcutaneous tissue: Secondary | ICD-10-CM

## 2020-12-30 DIAGNOSIS — L738 Other specified follicular disorders: Secondary | ICD-10-CM

## 2020-12-30 DIAGNOSIS — L918 Other hypertrophic disorders of the skin: Secondary | ICD-10-CM

## 2020-12-30 DIAGNOSIS — L720 Epidermal cyst: Secondary | ICD-10-CM

## 2020-12-30 DIAGNOSIS — Z86018 Personal history of other benign neoplasm: Secondary | ICD-10-CM | POA: Diagnosis not present

## 2020-12-31 NOTE — Progress Notes (Signed)
MRI of the knee shows more advanced arthritis changes in the knee than the x-ray showed.  Although we do see meniscus tears this is probably a lot of the source of your pain.  You do have meniscus tears on both the medial and lateral aspect of the knee which could be contributing to pain and dysfunction as well.  Recommend return to clinic to go over the MRI results in full detail.

## 2020-12-31 NOTE — Progress Notes (Signed)
I, Ronnie Willis, LAT, ATC acting as a scribe for Ronnie Leader, MD.  Ronnie Willis is a 70 y.o. male who presents to [place] [facility] today for f/u chronic bilat LBP and R knee pain w/ MRI review. Pt was last seen by Dr. Georgina Snell on 12/24/20 and was advised to proceed to knee MRI. Discussion w/ pt concluded w/ his desire to wait on a PT referral until after his knee MRI results. Today, pt reports continues to experience intermittent knee pain and c/o increased pain when sleeping.  Pt will sometimes experience a "stabbing" pain when walking.  Pt has been doing stretching to keep his LB feeling OK. Pt notes that throughout the day it will loosen up and not be too bothersome  Dx imaging: 12/28/20 R knee MRI  12/24/20 L-spine XR  01/07/14 L-spine XR  Pertinent review of systems: No fevers or chills  Relevant historical information: History left total knee replacement Dr. [name]   Exam:  BP 136/88   Pulse 60   Ht 5' 9.75" (1.772 m)   Wt 198 lb 9.6 oz (90.1 kg)   SpO2 99%   BMI 28.70 kg/m  General: Well Developed, well nourished, and in no acute distress.   MSK: Right knee mild tender palpation medial joint line normal motion with crepitation.  McMurray's test positive. L-spine nontender midline decreased lumbar motion.    Lab and Radiology Results No results found for this or any previous visit (from the past 72 hour(s)). MR Knee Right Wo Contrast  Result Date: 12/31/2020 CLINICAL DATA:  Chronic knee pain with mechanical symptoms. EXAM: MRI OF THE RIGHT KNEE WITHOUT CONTRAST TECHNIQUE: Multiplanar, multisequence MR imaging of the knee was performed. No intravenous contrast was administered. COMPARISON:  None. FINDINGS: MENISCI Medial: Partial radial tear of the posterior horn of the medial meniscus with peripheral meniscal extrusion. Diminutive size of the body of the medial meniscus likely reflecting degeneration. Lateral: Complex tear of the anterior horn-body  junction of the lateral meniscus. Complex tear of the posterior horn of the lateral meniscus towards the meniscal root. LIGAMENTS Cruciates: Intact ACL and PCL. Increased signal and expansion of the ACL as can be seen with mucinous degeneration. Collaterals: Medial collateral ligament is intact. Lateral collateral ligament complex is intact. CARTILAGE Patellofemoral: Partial-thickness cartilage loss of the patellofemoral compartment. Medial: Extensive full-thickness cartilage loss of the medial femorotibial compartment with subchondral reactive marrow edema. Lateral: High-grade partial-thickness cartilage loss with areas of full-thickness cartilage loss of the lateral femorotibial compartment most severe in the lateral tibial plateau. JOINT: Large joint effusion. Normal Hoffa's fat-pad. No plical thickening. POPLITEAL FOSSA: Popliteus tendon is intact. No Baker's cyst. EXTENSOR MECHANISM: Intact quadriceps tendon. Intact patellar tendon. Intact lateral patellar retinaculum. Intact medial patellar retinaculum. Intact MPFL. BONES: No aggressive osseous lesion. No fracture or dislocation. Other: No fluid collection or hematoma. Muscles are normal. IMPRESSION: 1. Partial radial tear of the posterior horn of the medial meniscus with peripheral meniscal extrusion. Diminutive size of the body of the medial meniscus likely reflecting degeneration. 2. Complex tear of the anterior horn-body junction of the lateral meniscus. Complex tear of the posterior horn of the lateral meniscus towards the meniscal root. 3. Tricompartmental cartilage abnormalities as described above consistent with advanced osteoarthritis. Electronically Signed   By: [name] M.D.   On: 12/31/2020 08:44       EXAM: LUMBAR SPINE - 2-3 VIEW   COMPARISON:  01/07/2014.   FINDINGS: Lumbar spine numbered the lowest  segmented appearing lumbar shaped vertebrae on lateral view as L5. Lumbar scoliosis concave right again noted. Diffuse multilevel  degenerative change again noted. Degenerative changes have progressed. Degenerative changes most prominent at L2-L3, L3-L4, and L4-L5. Small well-circumscribed sclerotic focus again noted in the left pelvis, most consistent with bone island. No acute bony abnormality identified. Punctate pelvic calcifications consistent phleboliths.   IMPRESSION: Lumbar scoliosis. Progressive diffuse multilevel degenerative change.     Electronically Signed   By: [name]  Register M.D.   On: 12/25/2020 11:47    I, Ronnie Willis, personally (independently) visualized and performed the interpretation of the images attached in this note.     Assessment and Plan: 70 y.o. male with knee pain.  Patient reportedly had an x-[facility] that [facility] that did not show much arthritis less than a year ago.  Unfortunately his MRI on September 10 shows pretty significant arthritis changes in both the medial and lateral compartment of the knee.  He does have meniscus tears but I believe the arthritis changes in his knee especially in the medial compartment are the main source of his knee pain.  I do not think he is a good candidate for meniscus debridement surgery as a result.  We discussed treatment plan and options.  He would like to avoid steroid injections.  Plan to work on authorization for hyaluronic acid injections and start that track.  Could proceed with steroid injections in the future if needed.  Ultimately if his symptoms continue to worsen he will need a knee replacement.  As for his back pain he has significant degenerative changes in his lumbar spine but has been managing pretty well with his home exercise program including core strengthening.  He would like to continue this for now and if needed is willing to consider physical therapy.  He will let me know and I will be happy to place an order for PT.    Discussed warning signs or symptoms. Please see discharge instructions. Patient expresses  understanding.   The above documentation has been reviewed and is accurate and complete Ronnie Willis, M.D.   Total encounter time 30 minutes including face-to-face time with the patient and, reviewing past medical record, and charting on the date of service.   Treatment plan and options.  Reviewed MRI findings.

## 2021-01-01 ENCOUNTER — Ambulatory Visit (INDEPENDENT_AMBULATORY_CARE_PROVIDER_SITE_OTHER): Payer: Medicare Other | Admitting: Family Medicine

## 2021-01-01 ENCOUNTER — Other Ambulatory Visit: Payer: Self-pay

## 2021-01-01 VITALS — BP 136/88 | HR 60 | Ht 69.75 in | Wt 198.6 lb

## 2021-01-01 DIAGNOSIS — G8929 Other chronic pain: Secondary | ICD-10-CM

## 2021-01-01 DIAGNOSIS — M25561 Pain in right knee: Secondary | ICD-10-CM

## 2021-01-01 DIAGNOSIS — M1711 Unilateral primary osteoarthritis, right knee: Secondary | ICD-10-CM

## 2021-01-01 DIAGNOSIS — M545 Low back pain, unspecified: Secondary | ICD-10-CM

## 2021-01-01 NOTE — Patient Instructions (Addendum)
Thank you for coming in today.   I will get the gel shots authorized.  You should hear soon about it.  We can also do the steroid injection at any time if we need to.   Continue quad strength.   Eventually this will require a knee replacement.   For your back I would recommend core strength exercises.   If not enough PT could help. Let me know.

## 2021-01-05 ENCOUNTER — Encounter: Payer: Self-pay | Admitting: Dermatology

## 2021-01-05 NOTE — Progress Notes (Signed)
   Follow-Up Visit   Subjective  Ronnie Willis is a 70 y.o. male who presents for the following: Annual Exam (Here for skin exam. No history of skin cancers. History of atypical moles. ).  General skin examination Location:  Duration:  Quality:  Associated Signs/Symptoms: Modifying Factors:  Severity:  Timing: Context:   Objective  Well appearing patient in no apparent distress; mood and affect are within normal limits. Full body skin exam: No recurrent atypical moles or current suspicious pigmented spots.  Smooth red 2 mm dermal papules  Right Upper Arm - Posterior For millimeter monochrome symmetric, light brown macule; no dermoscopic atypia  Right Tip of Nose Large milium versus small epidermoid cyst 3 mm white dermal papule  Left Axilla, Right Axilla Fleshy, skin-colored  pedunculated papules.      A full examination was performed including scalp, head, eyes, ears, nose, lips, neck, chest, axillae, abdomen, back, buttocks, bilateral upper extremities, bilateral lower extremities, hands, feet, fingers, toes, fingernails, and toenails. All findings within normal limits unless otherwise noted below.   Assessment & Plan    Screening for malignant neoplasm of skin  Yearly skin exam.  Encouraged to self examine twice annually.  Continue ultraviolet protection.  Hemangioma of skin  No intervention necessary  Lentigo Right Upper Arm - Posterior  Recheck as needed change  Sebaceous hyperplasia of face Left Nasal Sidewall  Epidermal cyst Right Tip of Nose  May leave if stable.  Skin tag (2) Left Axilla; Right Axilla  May choose future excision.      I, Lavonna Monarch, MD, have reviewed all documentation for this visit.  The documentation on 01/05/21 for the exam, diagnosis, procedures, and orders are all accurate and complete.

## 2021-01-06 ENCOUNTER — Ambulatory Visit (INDEPENDENT_AMBULATORY_CARE_PROVIDER_SITE_OTHER): Payer: Medicare Other | Admitting: Family Medicine

## 2021-01-06 ENCOUNTER — Other Ambulatory Visit: Payer: Self-pay

## 2021-01-06 ENCOUNTER — Encounter: Payer: Self-pay | Admitting: Family Medicine

## 2021-01-06 ENCOUNTER — Ambulatory Visit: Payer: Self-pay

## 2021-01-06 VITALS — BP 140/84 | HR 61 | Ht 69.75 in | Wt 198.6 lb

## 2021-01-06 DIAGNOSIS — G8929 Other chronic pain: Secondary | ICD-10-CM

## 2021-01-06 DIAGNOSIS — M1711 Unilateral primary osteoarthritis, right knee: Secondary | ICD-10-CM | POA: Diagnosis not present

## 2021-01-06 DIAGNOSIS — M25561 Pain in right knee: Secondary | ICD-10-CM

## 2021-01-06 MED ORDER — TRAMADOL HCL 50 MG PO TABS: 50.0000 mg | ORAL_TABLET | Freq: Three times a day (TID) | ORAL | 0 refills | Status: AC | PRN

## 2021-01-06 NOTE — Progress Notes (Signed)
I, Wendy Poet, LAT, ATC, am serving as scribe for Dr. [name].  Ronnie Willis is a 70 y.o. male who presents to [place] at [facility] today for f/u of R knee pain and R knee injection.  He was last seen by Dr. Georgina Snell on 01/01/21 and was advised that he could proceed w/ either a steroid or gel injection but he declined at that time.  Today, pt reports that he would like to discuss a knee injection as he will be driving 9 hours to [place] today.  He request some tramadol.  He notes that he is taking that in the past that helped a lot.  He does not like taking it frequently but notes that intermittently it can be very helpful.  He is going on vacation next week and will be back in October.  Diagnostic imaging: R knee MRI- 12/28/20; L-spine XR- 12/24/20   Pertinent review of systems: No fevers or chills  Relevant historical information: Depression history.  Takes lorazepam.   Exam:  BP 140/84 (BP Location: Left Arm, Patient Position: Sitting, Cuff Size: Normal)   Pulse 61   Ht 5' 9.75" (1.772 m)   Wt 198 lb 9.6 oz (90.1 kg)   SpO2 97%   BMI 28.70 kg/m  General: Well Developed, well nourished, and in no acute distress.   MSK: Right knee  normal motion with crepitation.  Mild joint effusion.    Lab and Radiology Results  Procedure: Real-time Ultrasound Guided Injection of right knee superior lateral patellar space Device: Philips Affiniti 50G Images permanently stored and available for review in PACS Verbal informed consent obtained.  Discussed risks and benefits of procedure. Warned about infection bleeding damage to structures skin hypopigmentation and fat atrophy among others. Patient expresses understanding and agreement Time-out conducted.   Noted no overlying erythema, induration, or other signs of local infection.   Skin prepped in a sterile fashion.   Local anesthesia: Topical Ethyl chloride.   With sterile technique and under real time ultrasound  guidance: 40 mg of Kenalog and 2 mL of lidocaine injected into knee joint. Fluid seen entering the joint capsule.   Completed without difficulty   Pain immediately resolved suggesting accurate placement of the medication.   Advised to call if fevers/chills, erythema, induration, drainage, or persistent bleeding.   Images permanently stored and available for review in the ultrasound unit.  Impression: Technically successful ultrasound guided injection.      EXAM: MRI OF THE RIGHT KNEE WITHOUT CONTRAST   TECHNIQUE: Multiplanar, multisequence MR imaging of the knee was performed. No intravenous contrast was administered.   COMPARISON:  None.   FINDINGS: MENISCI   Medial: Partial radial tear of the posterior horn of the medial meniscus with peripheral meniscal extrusion. Diminutive size of the body of the medial meniscus likely reflecting degeneration.   Lateral: Complex tear of the anterior horn-body junction of the lateral meniscus. Complex tear of the posterior horn of the lateral meniscus towards the meniscal root.   LIGAMENTS   Cruciates: Intact ACL and PCL. Increased signal and expansion of the ACL as can be seen with mucinous degeneration.   Collaterals: Medial collateral ligament is intact. Lateral collateral ligament complex is intact.   CARTILAGE   Patellofemoral: Partial-thickness cartilage loss of the patellofemoral compartment.   Medial: Extensive full-thickness cartilage loss of the medial femorotibial compartment with subchondral reactive marrow edema.   Lateral: High-grade partial-thickness cartilage loss with areas of full-thickness cartilage loss of the lateral  femorotibial compartment most severe in the lateral tibial plateau.   JOINT: Large joint effusion. Normal Hoffa's fat-pad. No plical thickening.   POPLITEAL FOSSA: Popliteus tendon is intact. No Baker's cyst.   EXTENSOR MECHANISM: Intact quadriceps tendon. Intact patellar tendon. Intact  lateral patellar retinaculum. Intact medial patellar retinaculum. Intact MPFL.   BONES: No aggressive osseous lesion. No fracture or dislocation.   Other: No fluid collection or hematoma. Muscles are normal.   IMPRESSION: 1. Partial radial tear of the posterior horn of the medial meniscus with peripheral meniscal extrusion. Diminutive size of the body of the medial meniscus likely reflecting degeneration. 2. Complex tear of the anterior horn-body junction of the lateral meniscus. Complex tear of the posterior horn of the lateral meniscus towards the meniscal root. 3. Tricompartmental cartilage abnormalities as described above consistent with advanced osteoarthritis.     Electronically Signed   By: [name] M.D.   On: 12/31/2020 08:44   I, [name], personally (independently) visualized and performed the interpretation of the images attached in this note.     Assessment and Plan: 70 y.o. male with knee pain.  Plan for steroid injection today.  We will schedule Orthovisc series starting in a few weeks when he returns from vacation.  We will do the steroid injection now so that he can have some good benefit immediately for his upcoming trip.  Limited tramadol.  Caution against driving with this medicine.   PDMP reviewed during this encounter. Orders Placed This Encounter  Procedures   [place] LIMITED JOINT SPACE STRUCTURES LOW RIGHT(NO LINKED CHARGES)    Order Specific Question:   Reason for Exam (SYMPTOM  OR DIAGNOSIS REQUIRED)    Answer:   R knee pain    Order Specific Question:   Preferred imaging location?    Answer:   [place]   Meds ordered this encounter  Medications   traMADol (ULTRAM) 50 MG tablet    Sig: Take 1 tablet (50 mg total) by mouth every 8 (eight) hours as needed for severe pain.    Dispense:  5 tablet    Refill:  0     Discussed warning signs or symptoms. Please see discharge instructions. Patient expresses  understanding.   The above documentation has been reviewed and is accurate and complete [name], M.D.

## 2021-01-06 NOTE — Patient Instructions (Addendum)
Good to see you today.  You had a R knee injection.  Call or go to the ER if you develop a large red swollen joint with extreme pain or oozing puss.   Check back after vacation and we will get the Orthovisc series scheduled.

## 2021-01-28 ENCOUNTER — Other Ambulatory Visit: Payer: Self-pay

## 2021-01-28 ENCOUNTER — Ambulatory Visit (INDEPENDENT_AMBULATORY_CARE_PROVIDER_SITE_OTHER): Payer: Medicare Other | Admitting: Family Medicine

## 2021-01-28 ENCOUNTER — Ambulatory Visit: Payer: Self-pay

## 2021-01-28 DIAGNOSIS — G8929 Other chronic pain: Secondary | ICD-10-CM

## 2021-01-28 DIAGNOSIS — M25561 Pain in right knee: Secondary | ICD-10-CM | POA: Diagnosis not present

## 2021-01-28 DIAGNOSIS — G5762 Lesion of plantar nerve, left lower limb: Secondary | ICD-10-CM

## 2021-01-28 NOTE — Patient Instructions (Addendum)
Good to see you today.  You had a R knee Orthovisc injection 1/3.  Call or go to the ER if you develop a large red swollen joint with extreme pain or oozing puss.   Please schedule 2 follow-up visits (1 each week for the next 2 weeks) to receive the remaining injections in the series.  Hapad Metatrasal pads, size Med.  If you like them, you may order more through [email or web address]   Morton Neuralgia Morton neuralgia is foot pain that affects the ball of the foot and the area near the toes. Morton neuralgia occurs when part of a nerve in the foot (digital nerve) is under too much pressure (compressed). When this happens over a long period of time, the nerve can thicken (neuroma) and cause pain. Pain usually occurs between the third and fourth toes.  Morton neuralgia can come and go but may get worse over time. What are the causes? This condition is caused by doing the same things over and over with your foot, such as: Activities such as running or jumping. Wearing shoes that are too tight. What increases the risk? You may be at higher risk for Morton neuralgia if you: Are male. Wear high heels. Wear shoes that are narrow or tight. Do activities that repeatedly stretch your toes, such as: Running. Ballet. Long-distance walking. What are the signs or symptoms? The first symptom of Morton neuralgia is pain that spreads from the ball of the foot to the toes. It may feel like you are walking on a marble. Pain usually gets worse with walking and goes away at night. Other symptoms may include numbness and cramping of your toes. Both feet are equally affected, but rarely at the same time. How is this diagnosed? This condition is diagnosed based on your symptoms, your medical history, and a physical exam. Your health care provider may: Squeeze your foot just behind your toe. Ask you to move your toes to check for pain. Ask about your physical activity level. You also may have imaging tests, such  as an X-ray, ultrasound, or MRI. How is this treated? Treatment depends on how severe your condition is and what causes it. Treatment may involve: Wearing different shoes that are not too tight, are low-heeled, and provide good support. For some people, this is the only treatment needed. Wearing an over-the-counter or custom supportive pad (orthotic) under the front of your foot. Getting injections of numbing medicine and anti-inflammatory medicine (steroid) in the nerve. Having surgery to remove part of the thickened nerve. Follow these instructions at home: Managing pain, stiffness, and swelling  Massage your foot as needed. Wear orthotics as told by your health care provider. If directed, put ice on your foot: Put ice in a plastic bag. Place a towel between your skin and the bag. Leave the ice on for 20 minutes, 2-3 times a day. Avoid activities that cause pain or make pain worse. If you play sports, ask your health care provider when it is safe for you to return to sports. Raise (elevate) your foot above the level of your heart while lying down and, when possible, while sitting. General instructions Take over-the-counter and prescription medicines only as told by your health care provider. Do not drive or use heavy machinery while taking prescription pain medicine. Wear shoes that: Have soft soles. Have a wide toe area. Provide arch support. Do not pinch or squeeze your feet. Have room for your orthotics, if applicable. Keep all follow-up visits as told by your  health care provider. This is important. Contact a health care provider if: Your symptoms get worse or do not get better with treatment and home care. Summary Morton neuralgia is foot pain that affects the ball of the foot and the area near the toes. Pain usually occurs between the third and fourth toes, gets worse with walking, and goes away at night. Morton neuralgia occurs when part of a nerve in the foot (digital nerve)  is under too much pressure. When this happens over a long period of time, the nerve can thicken (neuroma) and cause pain. This condition is caused by doing the same things over and over with your foot, such as running or jumping, wearing shoes that are too tight, or wearing high heels. Treatment may involve wearing low-heeled shoes that are not too tight, wearing a supportive pad (orthotic) under the front of your foot, getting injections in the nerve, or having surgery to remove part of the thickened nerve. This information is not intended to replace advice given to you by your health care provider. Make sure you discuss any questions you have with your health care provider. Document Revised: 07/16/2020 Document Reviewed: 04/20/2017 Elsevier Patient Education  2022 [employer].

## 2021-01-28 NOTE — Progress Notes (Signed)
           Ronnie Willis is a 70 y.o. male who presents to [place] at [facility] today for previously scheduled right knee Orthovisc injection.  However he also brought up left toe paresthesia.  Left toe paresthesia: Ongoing for years worse with standing or walking.  Patient has paresthesia to his left second and third toe.  This does not bother him with rest and sleep but does bother him with walking.  He has not tried much treatment.  Pertinent review of systems: No fevers or chills  Relevant historical information: Anxiety   Heart rate 80 General: Well Developed, well nourished, and in no acute distress.   MSK: Right knee joint effusion.  No erythema.  Left foot nontender.  Normal foot motion. Positive metatarsal squeeze test.    Lab and Radiology Results   Orthovisc right knee 1/3 Procedure: Real-time Ultrasound Guided Injection of right knee superior lateral patellar space Device: Philips Affiniti 50G Images permanently stored and available for review in PACS Verbal informed consent obtained.  Discussed risks and benefits of procedure. Warned about infection bleeding damage to structures skin hypopigmentation and fat atrophy among others. Patient expresses understanding and agreement Time-out conducted.   Noted no overlying erythema, induration, or other signs of local infection.   Skin prepped in a sterile fashion.   Local anesthesia: Topical Ethyl chloride.   With sterile technique and under real time ultrasound guidance: Orthovisc 30 mg injected into knee joint. Fluid seen entering the joint capsule.   Completed without difficulty   Advised to call if fevers/chills, erythema, induration, drainage, or persistent bleeding.   Images permanently stored and available for review in the ultrasound unit.  Impression: Technically successful ultrasound guided injection.  Lot number: [id number]  Recheck 1 week injection right knee 2/3      Assessment and  Plan: 70 y.o. male with right knee pain.  Proceed with previously scheduled right knee Orthovisc injection 1/3.  No office visit charge for that injection.  Left foot paresthesia.  Paresthesias along lesser toes thought to be possible Morton's neuroma.  Plan to treat initially with metatarsal pads and reassess as scheduled next week for his visit for his Orthovisc injection.  If not a little bit better would consider possibly injection.   PDMP not reviewed this encounter. Orders Placed This Encounter  Procedures   [place] LIMITED JOINT SPACE STRUCTURES LOW RIGHT(NO LINKED CHARGES)    Standing Status:   Future    Number of Occurrences:   1    Standing Expiration Date:   07/29/2021    Order Specific Question:   Reason for Exam (SYMPTOM  OR DIAGNOSIS REQUIRED)    Answer:   right knee pain    Order Specific Question:   Preferred imaging location?    Answer:   [place]   No orders of the defined types were placed in this encounter.    Discussed warning signs or symptoms. Please see discharge instructions. Patient expresses understanding.   The above documentation has been reviewed and is accurate and complete [name], M.D.

## 2021-02-04 ENCOUNTER — Other Ambulatory Visit: Payer: Self-pay

## 2021-02-04 ENCOUNTER — Ambulatory Visit (INDEPENDENT_AMBULATORY_CARE_PROVIDER_SITE_OTHER): Payer: Medicare Other | Admitting: Family Medicine

## 2021-02-04 ENCOUNTER — Ambulatory Visit: Payer: Self-pay

## 2021-02-04 DIAGNOSIS — M25561 Pain in right knee: Secondary | ICD-10-CM

## 2021-02-04 DIAGNOSIS — M1711 Unilateral primary osteoarthritis, right knee: Secondary | ICD-10-CM | POA: Diagnosis not present

## 2021-02-04 DIAGNOSIS — G8929 Other chronic pain: Secondary | ICD-10-CM

## 2021-02-04 NOTE — Progress Notes (Signed)
Kalim presents to clinic today for Orthovisc right knee 2/3  Procedure: Real-time Ultrasound Guided Injection of right knee superior lateral patellar space Device: Philips Affiniti 50G Images permanently stored and available for review in PACS Verbal informed consent obtained.  Discussed risks and benefits of procedure. Warned about infection bleeding damage to structures skin hypopigmentation and fat atrophy among others. Patient expresses understanding and agreement Time-out conducted.   Noted no overlying erythema, induration, or other signs of local infection.   Skin prepped in a sterile fashion.   Local anesthesia: Topical Ethyl chloride.   With sterile technique and under real time ultrasound guidance: Orthovisc 30 mg injected into the knee joint. Fluid seen entering the joint capsule.   Completed without difficulty     Advised to call if fevers/chills, erythema, induration, drainage, or persistent bleeding.   Images permanently stored and available for review in the ultrasound unit.  Impression: Technically successful ultrasound guided injection.  Lot number: [id number]  Recheck in 1 week for Orthovisc injection right knee 3/3  Incidentally he was treated last week for presumed Morton's neuroma with metatarsal pads and notes significant improvement.

## 2021-02-04 NOTE — Patient Instructions (Addendum)
Thank you for coming in today.   You received an injection today. Seek immediate medical attention if the joint becomes red, extremely painful, or is oozing fluid.   Hapads metatarsal pads, size small or medium  We will see you next week for your 3rd Orthovisc injection.

## 2021-02-11 ENCOUNTER — Ambulatory Visit: Payer: Self-pay

## 2021-02-11 ENCOUNTER — Other Ambulatory Visit: Payer: Self-pay

## 2021-02-11 ENCOUNTER — Ambulatory Visit (INDEPENDENT_AMBULATORY_CARE_PROVIDER_SITE_OTHER): Payer: Medicare Other | Admitting: Family Medicine

## 2021-02-11 DIAGNOSIS — G8929 Other chronic pain: Secondary | ICD-10-CM

## 2021-02-11 DIAGNOSIS — M25561 Pain in right knee: Secondary | ICD-10-CM

## 2021-02-11 DIAGNOSIS — M1711 Unilateral primary osteoarthritis, right knee: Secondary | ICD-10-CM | POA: Diagnosis not present

## 2021-02-11 NOTE — Progress Notes (Signed)
Md presents to clinic today for Orthovisc injection knee 3/3  Procedure: Real-time Ultrasound Guided Injection of right knee superior lateral patellar space Device: Philips Affiniti 50G Images permanently stored and available for review in PACS Verbal informed consent obtained.  Discussed risks and benefits of procedure. Warned about infection bleeding damage to structures skin hypopigmentation and fat atrophy among others. Patient expresses understanding and agreement Time-out conducted.   Noted no overlying erythema, induration, or other signs of local infection.   Skin prepped in a sterile fashion.   Local anesthesia: Topical Ethyl chloride.   With sterile technique and under real time ultrasound guidance: Orthovisc 30 mg injected into knee joint. Fluid seen entering the joint capsule.   Completed without difficulty   Advised to call if fevers/chills, erythema, induration, drainage, or persistent bleeding.   Images permanently stored and available for review in the ultrasound unit.  Impression: Technically successful ultrasound guided injection.  Lot [id number]  Return as needed  Spent time also discussing his Morton's neuroma which has improved with wider shoes and metatarsal pads.  Also discussed his attempt to apply for a Medicare supplement which was denied when he revealed that he may have a knee surgery.  I offered to write him a letter if needed.

## 2021-02-11 NOTE — Patient Instructions (Signed)
Good to see you today.  You had your final R knee Orthovisc injection in the series.  Follow-up as needed.

## 2021-03-21 ENCOUNTER — Other Ambulatory Visit: Payer: Self-pay

## 2021-03-21 ENCOUNTER — Ambulatory Visit (INDEPENDENT_AMBULATORY_CARE_PROVIDER_SITE_OTHER): Payer: Medicare Other | Admitting: Family Medicine

## 2021-03-21 VITALS — BP 122/82 | HR 65 | Ht 69.75 in | Wt 202.2 lb

## 2021-03-21 DIAGNOSIS — G8929 Other chronic pain: Secondary | ICD-10-CM

## 2021-03-21 DIAGNOSIS — M545 Low back pain, unspecified: Secondary | ICD-10-CM | POA: Diagnosis not present

## 2021-03-21 NOTE — Patient Instructions (Signed)
Thank you for coming in today.   I've referred you to Physical Therapy.  Let us know if you don't hear from them in one week.   You should hear from MRI scheduling within 1 week. If you do not hear please let me know.    Recheck in 1 month or so.   We will go over the MRI then.

## 2021-03-21 NOTE — Progress Notes (Signed)
I, Ronnie Willis, LAT, ATC acting as a scribe for Ronnie Leader, MD.  Ronnie Willis is a 70 y.o. male who presents to [place] at [facility] today for low back pain. Pt was previously seen by Dr. Georgina Willis on 02/11/21 for chronic R knee pain, completing the Orthovisc series. Today, pt c/o LBP x a few months. Pt reports by the end of the day, esp w/ increased activity, his low back will be painful. Pt locates pain to slightly proximal to the bilat buttocks, R>L. Pt recalls that he suffered a fall, landing on L side, while traveling in [place] on 11/13, but pain had improved.  He did not have any pain immediately after the fall.  He does not think he broke anything.  Radiating pain: slightly into buttocks LE numbness/tingling: no LE weakness: no Aggravates: increased activity, lifting things Treatments tried: stretching, Tylenol, IBU.  Patient has completed a home exercise program since September 9.  He was taught this by athletic trainer in my clinic and directed by physician.  He does this daily for at least 10 to 20 minutes.  Dx imaging: 12/24/20 L-spine XR  01/07/14 L-spine XR  Pertinent review of systems: No fevers or chills  Relevant historical information: Knee arthritis.   Exam:  BP 122/82   Pulse 65   Ht 5' 9.75" (1.772 m)   Wt 202 lb 3.2 oz (91.7 kg)   SpO2 97%   BMI 29.22 kg/m  General: Well Developed, well nourished, and in no acute distress.   MSK: L-spine nontender midline.  Tender palpation lumbar paraspinal musculature. Decreased lumbar motion. Lower extremity strength is intact.    Lab and Radiology Results  EXAM: LUMBAR SPINE - 2-3 VIEW   COMPARISON:  01/07/2014.   FINDINGS: Lumbar spine numbered the lowest segmented appearing lumbar shaped vertebrae on lateral view as L5. Lumbar scoliosis concave right again noted. Diffuse multilevel degenerative change again noted. Degenerative changes have progressed. Degenerative changes  most prominent at L2-L3, L3-L4, and L4-L5. Small well-circumscribed sclerotic focus again noted in the left pelvis, most consistent with bone island. No acute bony abnormality identified. Punctate pelvic calcifications consistent phleboliths.   IMPRESSION: Lumbar scoliosis. Progressive diffuse multilevel degenerative change.     Electronically Signed   By: Ronnie Willis  Register M.D.   On: 12/25/2020 11:47 I, Ronnie Willis, personally (independently) visualized and performed the interpretation of the images attached in this note.      Assessment and Plan: 70 y.o. male with  Chronic low back pain.  Pain is mostly left-sided but does occur on the right side a bit.  Denies much radiating pain.  Pain is worse with activity better with some rest. Ronnie Willis was taught a physician directed home exercise program starting on September 6 and continues these exercises daily.  This is helped some but is not sufficient control his pain.  At this point plan to proceed to MRI for potential injection planning.  Additionally will optimize with physical therapy.  Recheck in 1 month.   PDMP not reviewed this encounter. Orders Placed This Encounter  Procedures   MR Lumbar Spine Wo Contrast    Standing Status:   Future    Standing Expiration Date:   03/21/2022    Order Specific Question:   What is the patient's sedation requirement?    Answer:   No Sedation    Order Specific Question:   Does the patient have a pacemaker or implanted devices?    Answer:  No    Order Specific Question:   Preferred imaging location?    Answer:   [facility] (table limit-350lbs)   Ambulatory referral to Physical Therapy    Referral Priority:   Routine    Referral Type:   Physical Medicine    Referral Reason:   Specialty Services Required    Requested Specialty:   Physical Therapy    Number of Visits Requested:   1   No orders of the defined types were placed in this encounter.    Discussed warning signs or  symptoms. Please see discharge instructions. Patient expresses understanding.   The above documentation has been reviewed and is accurate and complete Ronnie Willis, M.D.

## 2021-08-21 ENCOUNTER — Encounter: Payer: Self-pay | Admitting: Dermatology

## 2021-08-21 ENCOUNTER — Ambulatory Visit (INDEPENDENT_AMBULATORY_CARE_PROVIDER_SITE_OTHER): Payer: Medicare Other | Admitting: Dermatology

## 2021-08-21 DIAGNOSIS — L239 Allergic contact dermatitis, unspecified cause: Secondary | ICD-10-CM | POA: Diagnosis not present

## 2021-08-21 MED ORDER — CLOBETASOL PROPIONATE 0.05 % EX CREA: TOPICAL_CREAM | CUTANEOUS | 0 refills | Status: AC

## 2021-09-08 ENCOUNTER — Encounter: Payer: Self-pay | Admitting: Dermatology

## 2021-09-08 NOTE — Progress Notes (Signed)
   Follow-Up Visit   Subjective  Ronnie Willis is a 71 y.o. male who presents for the following: Rash (Bumps on right hand x 4 days- no itch, not sore- tx- neosporin- seem better today).  Acne on right hand for past 4 to 5 days Location:  Duration:  Quality:  Associated Signs/Symptoms: Modifying Factors:  Severity:  Timing: Context:   Objective  Well appearing patient in no apparent distress; mood and affect are within normal limits. Right Hand - Posterior Right hand bite like lesions x 5 days, checked lymph nodes and they are clear .  The differential diagnosis includes bites, endogenous inflammatory processes like neutrophilic dermatoses, typical contact allergy.  Directly it is improving in the last 24 hours.       A focused examination was performed including head, neck, arms, lymph nodes. Relevant physical exam findings are noted in the Assessment and Plan.   Assessment & Plan    Allergic contact dermatitis, unspecified trigger Right Hand - Posterior  Topical clobetasol twice daily for 10 days, may cover the cream with a moist cloth.  Follow-up by MyChart or phone next week.  clobetasol cream (TEMOVATE) 0.05 % - Right Hand - Posterior Apply to hand 1-2 times daily until improved ( not for face or skin folds)      I, Lavonna Monarch, MD, have reviewed all documentation for this visit.  The documentation on 09/08/21 for the exam, diagnosis, procedures, and orders are all accurate and complete.

## 2021-12-02 ENCOUNTER — Ambulatory Visit: Payer: Medicare Other | Admitting: Gastroenterology

## 2021-12-30 ENCOUNTER — Ambulatory Visit: Payer: Medicare Other | Admitting: Dermatology

## 2022-07-31 IMAGING — MR MR KNEE*R* W/O CM
6 of 7 series · 29 of 40 positions shown · non-contrast
Comparison: None.

CLINICAL DATA: Chronic knee pain with mechanical symptoms.

EXAM:
MRI OF THE RIGHT KNEE WITHOUT CONTRAST
TECHNIQUE: Multiplanar, multisequence MR imaging of the knee was performed. No
intravenous contrast was administered.

[Series 3: T2 fat-sat · axial · 4.0mm · 0.31mm/px · z∈[-63,+92]mm · 7 of 32 slices shown (1 of 3)]
[im 1/32]
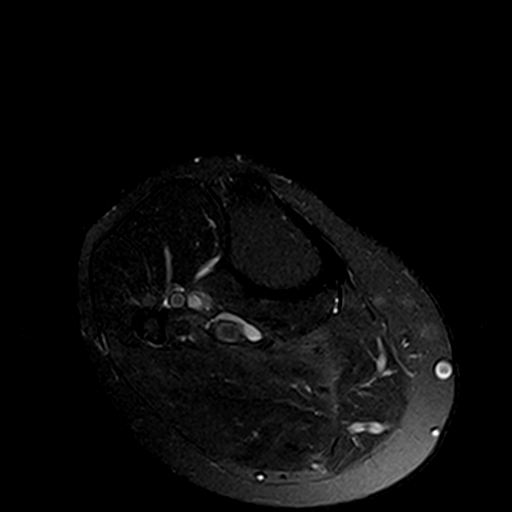
[im 6/32]
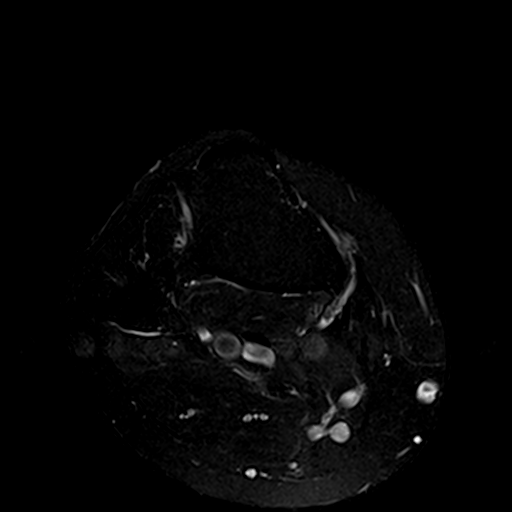
[im 11/32]
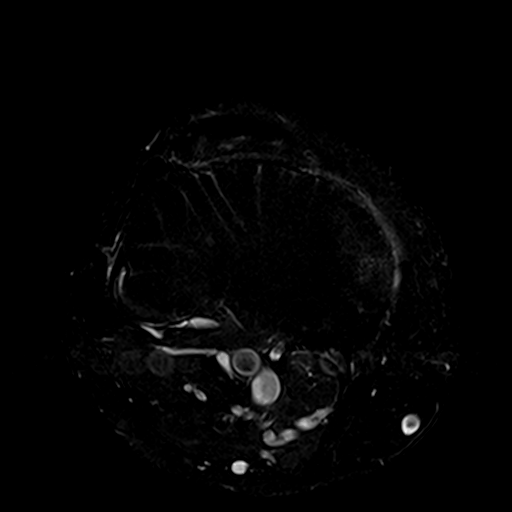
[im 16/32]
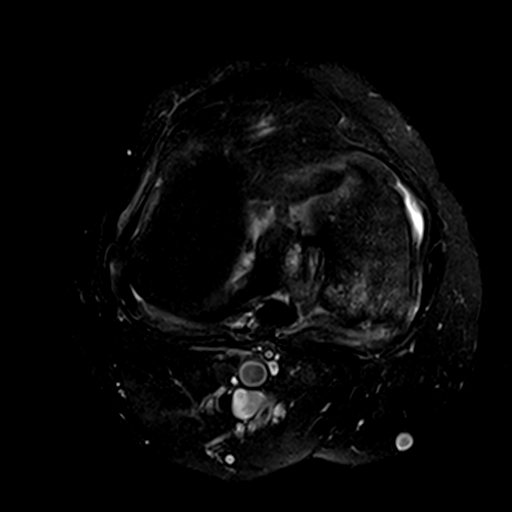
[im 21/32]
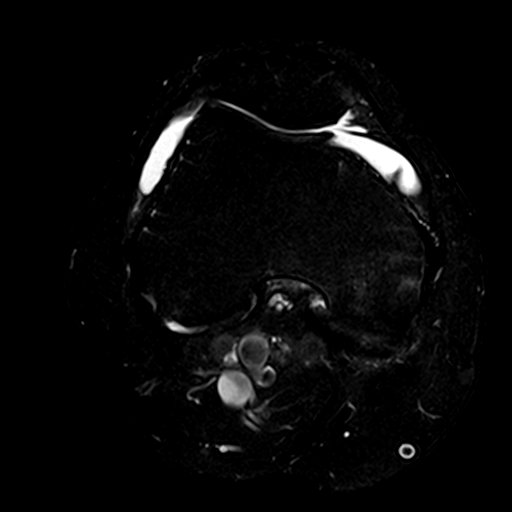
[im 26/32]
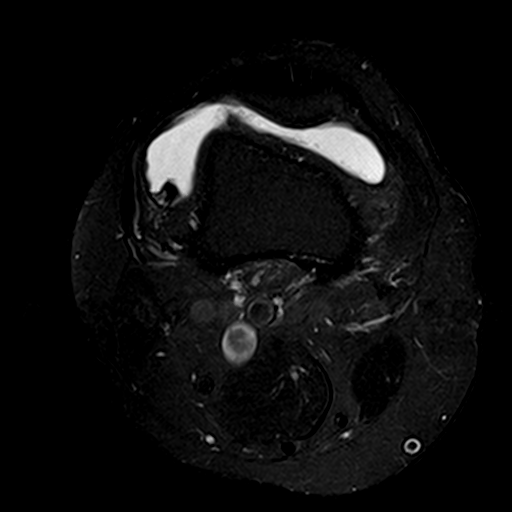
[im 32/32]
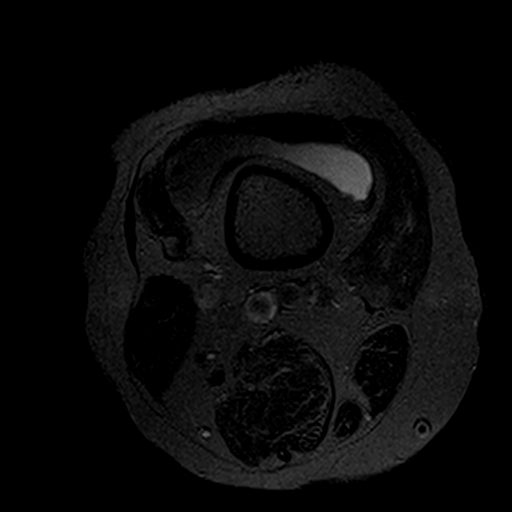

[Series 5: T2 fat-sat · coronal · 4.0mm · 0.29mm/px · 5 of 28 slices shown (2 of 3)]
[im 1/28]
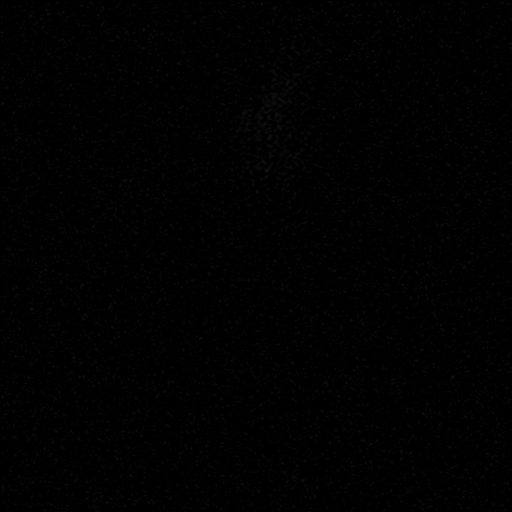
[im 7/28]
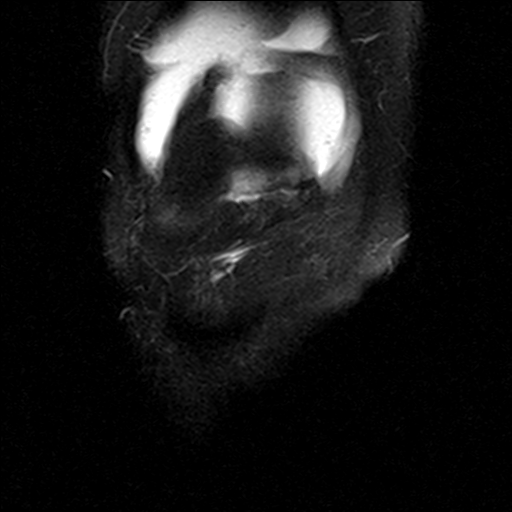
[im 14/28]
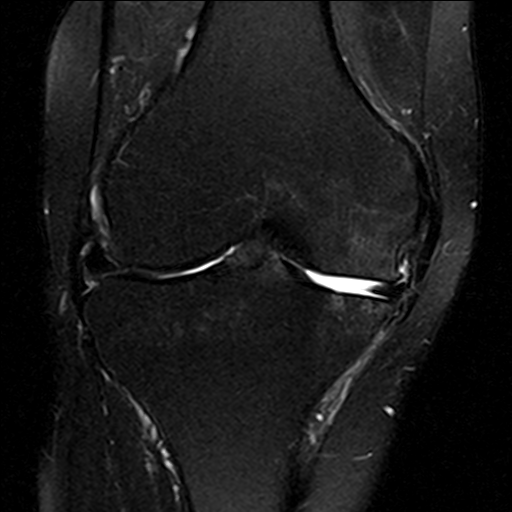
[im 21/28]
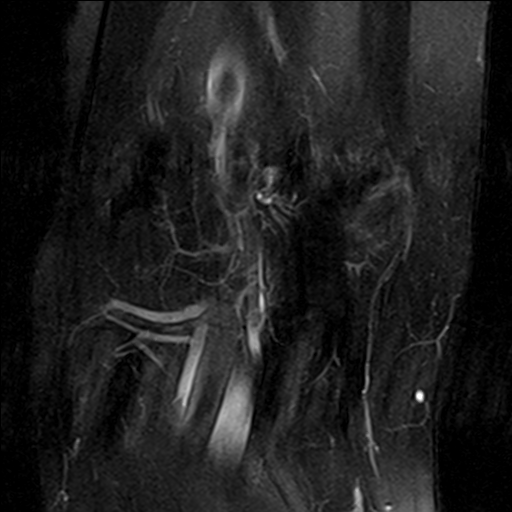
[im 28/28]
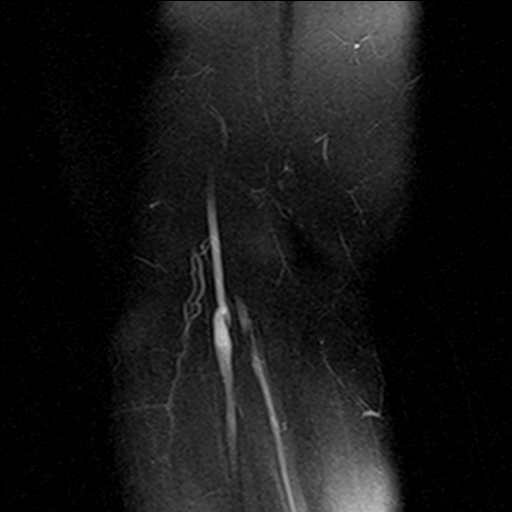

[Series 6: PD fat-sat · coronal · 4.0mm · 0.59mm/px · 5 of 28 slices shown (1 of 3)]
[im 1/28]
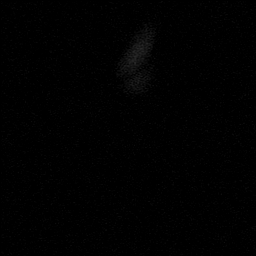
[im 7/28]
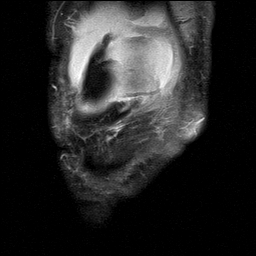
[im 14/28]
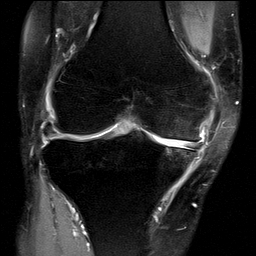
[im 21/28]
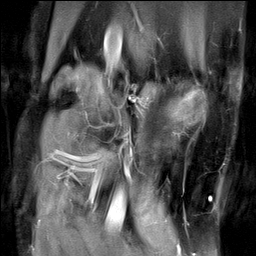
[im 28/28]
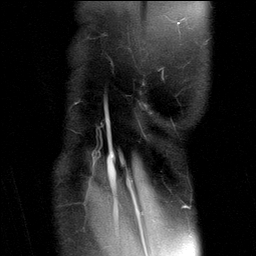

[Series 7: PD fat-sat · sagittal · 3.0mm · 0.59mm/px · 7 of 34 slices shown (2 of 3)]
[im 1/34]
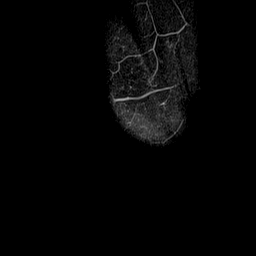
[im 6/34]
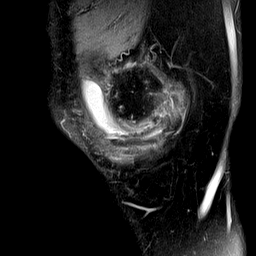
[im 12/34]
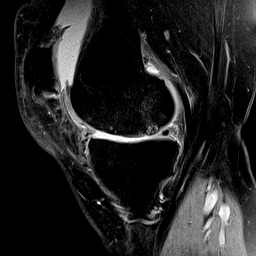
[im 17/34]
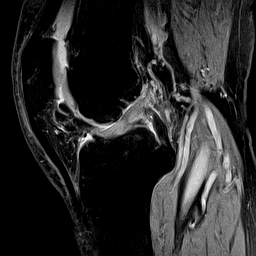
[im 23/34]
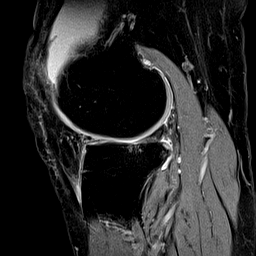
[im 28/34]
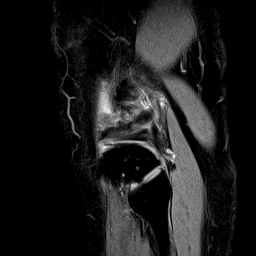
[im 34/34]
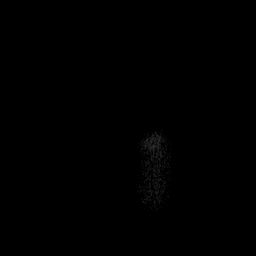

[Series 8: T2 fat-sat · sagittal · 3.0mm · 0.29mm/px · 1 of 34 slices shown (3 of 3)]
[im 1/34]
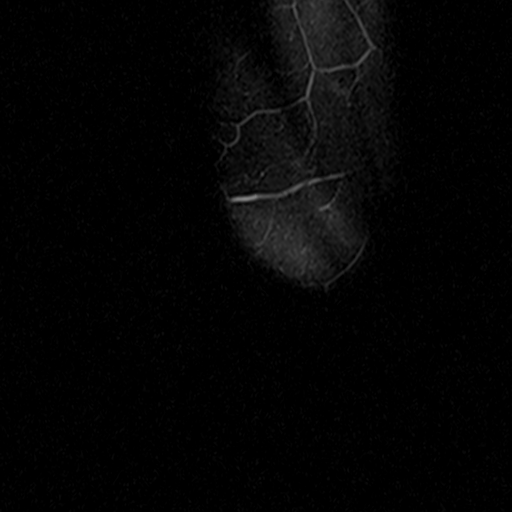

[Series 9: PD fat-sat · oblique · 2.0mm · 0.59mm/px · 4 of 22 slices shown (3 of 3)]
[im 1/22]
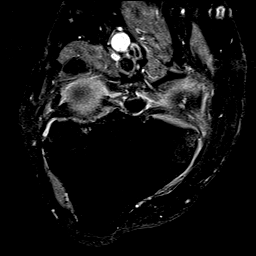
[im 8/22]
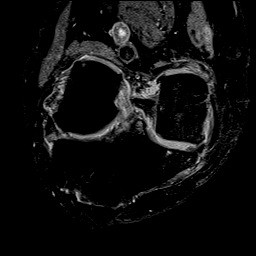
[im 15/22]
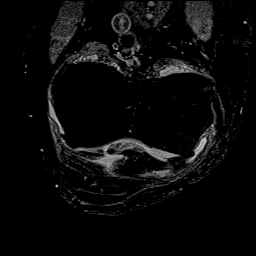
[im 22/22]
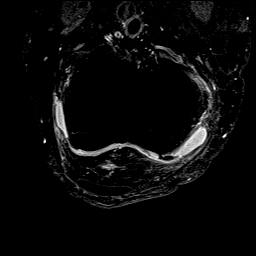

[29 of 40 positions shown; findings below may reference images not displayed]

FINDINGS: MENISCI

Medial: Partial radial tear of the posterior horn of the medial
meniscus with peripheral meniscal extrusion. Diminutive size of the
body of the medial meniscus likely reflecting degeneration.

Lateral: Complex tear of the anterior horn-body junction of the
lateral meniscus. Complex tear of the posterior horn of the lateral
meniscus towards the meniscal root.

LIGAMENTS

Cruciates: Intact ACL and PCL. Increased signal and expansion of the
ACL as can be seen with mucinous degeneration.

Collaterals: Medial collateral ligament is intact. Lateral
collateral ligament complex is intact.

CARTILAGE

Patellofemoral: Partial-thickness cartilage loss of the
patellofemoral compartment.

Medial: Extensive full-thickness cartilage loss of the medial
femorotibial compartment with subchondral reactive marrow edema.

Lateral: High-grade partial-thickness cartilage loss with areas of
full-thickness cartilage loss of the lateral femorotibial
compartment most severe in the lateral tibial plateau.

JOINT: Large joint effusion. Normal Ivis Pritchett. No plical
thickening.

POPLITEAL FOSSA: Popliteus tendon is intact. No Baker's cyst.

EXTENSOR MECHANISM: Intact quadriceps tendon. Intact patellar
tendon. Intact lateral patellar retinaculum. Intact medial patellar
retinaculum. Intact MPFL.

BONES: No aggressive osseous lesion. No fracture or dislocation.

Other: No fluid collection or hematoma. Muscles are normal.
IMPRESSION: 1. Partial radial tear of the posterior horn of the medial meniscus
with peripheral meniscal extrusion. Diminutive size of the body of
the medial meniscus likely reflecting degeneration.
2. Complex tear of the anterior horn-body junction of the lateral
meniscus. Complex tear of the posterior horn of the lateral meniscus
towards the meniscal root.
3. Tricompartmental cartilage abnormalities as described above
consistent with advanced osteoarthritis.

## 2023-01-27 ENCOUNTER — Other Ambulatory Visit: Payer: Self-pay | Admitting: Urology

## 2023-01-27 DIAGNOSIS — R972 Elevated prostate specific antigen [PSA]: Secondary | ICD-10-CM

## 2023-03-16 ENCOUNTER — Ambulatory Visit
Admission: RE | Admit: 2023-03-16 | Discharge: 2023-03-16 | Disposition: A | Discharge status: Home / Self Care | Payer: Medicare Other | Source: Ambulatory Visit | Attending: Urology | Admitting: Urology

## 2023-03-16 DIAGNOSIS — R972 Elevated prostate specific antigen [PSA]: Secondary | ICD-10-CM

## 2023-03-16 MED ORDER — GADOPICLENOL 0.5 MMOL/ML IV SOLN
9.0000 mL | Freq: Once | INTRAVENOUS | Status: AC | PRN
  Administered 2023-03-16: 9 mL via INTRAVENOUS

## 2023-07-22 ENCOUNTER — Other Ambulatory Visit: Payer: Self-pay | Admitting: Urology

## 2023-07-22 DIAGNOSIS — C61 Malignant neoplasm of prostate: Secondary | ICD-10-CM

## 2023-09-27 ENCOUNTER — Ambulatory Visit
Admission: RE | Admit: 2023-09-27 | Discharge: 2023-09-27 | Disposition: A | Discharge status: Home / Self Care | Source: Ambulatory Visit | Attending: Urology | Admitting: Urology

## 2023-09-27 DIAGNOSIS — C61 Malignant neoplasm of prostate: Secondary | ICD-10-CM

## 2023-09-27 MED ORDER — GADOPICLENOL 0.5 MMOL/ML IV SOLN
10.0000 mL | Freq: Once | INTRAVENOUS | Status: AC | PRN
  Administered 2023-09-27: 10 mL via INTRAVENOUS
